# Patient Record
Sex: Female | Born: 1995 | Race: White | Hispanic: No | Marital: Married | Smoking: Current every day smoker
Health system: Southern US, Community
[De-identification: ages and names within clinical notes are randomized; demographics above are authoritative.]

## PROBLEM LIST (undated history)

## (undated) DIAGNOSIS — Z227 Latent tuberculosis: Secondary | ICD-10-CM

## (undated) DIAGNOSIS — F419 Anxiety disorder, unspecified: Secondary | ICD-10-CM

## (undated) DIAGNOSIS — R251 Tremor, unspecified: Secondary | ICD-10-CM

## (undated) DIAGNOSIS — M1990 Unspecified osteoarthritis, unspecified site: Secondary | ICD-10-CM

## (undated) DIAGNOSIS — I10 Essential (primary) hypertension: Secondary | ICD-10-CM

## (undated) DIAGNOSIS — F4541 Pain disorder exclusively related to psychological factors: Secondary | ICD-10-CM

## (undated) DIAGNOSIS — T148XXA Other injury of unspecified body region, initial encounter: Secondary | ICD-10-CM

## (undated) DIAGNOSIS — R0989 Other specified symptoms and signs involving the circulatory and respiratory systems: Secondary | ICD-10-CM

## (undated) DIAGNOSIS — R29898 Other symptoms and signs involving the musculoskeletal system: Secondary | ICD-10-CM

## (undated) DIAGNOSIS — F329 Major depressive disorder, single episode, unspecified: Secondary | ICD-10-CM

## (undated) DIAGNOSIS — F1911 Other psychoactive substance abuse, in remission: Secondary | ICD-10-CM

## (undated) DIAGNOSIS — F32A Depression, unspecified: Secondary | ICD-10-CM

## (undated) DIAGNOSIS — H5010 Unspecified exotropia: Secondary | ICD-10-CM

## (undated) HISTORY — PX: WISDOM TOOTH EXTRACTION: SHX21

## (undated) HISTORY — PX: STRABISMUS SURGERY: SHX218

---
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
## 2014-02-27 ENCOUNTER — Encounter (HOSPITAL_COMMUNITY): Payer: Self-pay | Admitting: Emergency Medicine

## 2014-02-27 ENCOUNTER — Emergency Department (HOSPITAL_COMMUNITY)
Admission: EM | Admit: 2014-02-27 | Discharge: 2014-02-27 | Disposition: A | Discharge status: Home / Self Care | Payer: Medicaid Other | Attending: Emergency Medicine | Admitting: Emergency Medicine

## 2014-02-27 DIAGNOSIS — F17200 Nicotine dependence, unspecified, uncomplicated: Secondary | ICD-10-CM | POA: Insufficient documentation

## 2014-02-27 DIAGNOSIS — F329 Major depressive disorder, single episode, unspecified: Secondary | ICD-10-CM | POA: Insufficient documentation

## 2014-02-27 DIAGNOSIS — F3289 Other specified depressive episodes: Secondary | ICD-10-CM | POA: Insufficient documentation

## 2014-02-27 DIAGNOSIS — Z87828 Personal history of other (healed) physical injury and trauma: Secondary | ICD-10-CM | POA: Diagnosis not present

## 2014-02-27 DIAGNOSIS — F1410 Cocaine abuse, uncomplicated: Secondary | ICD-10-CM | POA: Diagnosis present

## 2014-02-27 DIAGNOSIS — Z3202 Encounter for pregnancy test, result negative: Secondary | ICD-10-CM | POA: Diagnosis not present

## 2014-02-27 DIAGNOSIS — F1210 Cannabis abuse, uncomplicated: Secondary | ICD-10-CM | POA: Insufficient documentation

## 2014-02-27 DIAGNOSIS — F1910 Other psychoactive substance abuse, uncomplicated: Secondary | ICD-10-CM

## 2014-02-27 LAB — RAPID URINE DRUG SCREEN, HOSP PERFORMED
Amphetamines: NOT DETECTED
Barbiturates: NOT DETECTED
Benzodiazepines: NOT DETECTED
COCAINE: POSITIVE — AB
OPIATES: NOT DETECTED
Tetrahydrocannabinol: POSITIVE — AB

## 2014-02-27 LAB — CBC WITH DIFFERENTIAL/PLATELET
Basophils Absolute: 0 10*3/uL (ref 0.0–0.1)
Basophils Relative: 0 % (ref 0–1)
EOS ABS: 0.3 10*3/uL (ref 0.0–0.7)
Eosinophils Relative: 4 % (ref 0–5)
HCT: 40.9 % (ref 36.0–46.0)
Hemoglobin: 13.8 g/dL (ref 12.0–15.0)
LYMPHS ABS: 2.3 10*3/uL (ref 0.7–4.0)
Lymphocytes Relative: 33 % (ref 12–46)
MCH: 29.9 pg (ref 26.0–34.0)
MCHC: 33.7 g/dL (ref 30.0–36.0)
MCV: 88.5 fL (ref 78.0–100.0)
Monocytes Absolute: 0.3 10*3/uL (ref 0.1–1.0)
Monocytes Relative: 5 % (ref 3–12)
Neutro Abs: 4.1 10*3/uL (ref 1.7–7.7)
Neutrophils Relative %: 58 % (ref 43–77)
Platelets: 313 10*3/uL (ref 150–400)
RBC: 4.62 MIL/uL (ref 3.87–5.11)
RDW: 13 % (ref 11.5–15.5)
WBC: 7 10*3/uL (ref 4.0–10.5)

## 2014-02-27 LAB — BASIC METABOLIC PANEL
Anion gap: 14 (ref 5–15)
BUN: 7 mg/dL (ref 6–23)
CO2: 23 mEq/L (ref 19–32)
Calcium: 9.5 mg/dL (ref 8.4–10.5)
Chloride: 104 mEq/L (ref 96–112)
Creatinine, Ser: 0.83 mg/dL (ref 0.50–1.10)
GLUCOSE: 93 mg/dL (ref 70–99)
Potassium: 4.1 mEq/L (ref 3.7–5.3)
SODIUM: 141 meq/L (ref 137–147)

## 2014-02-27 LAB — ETHANOL: Alcohol, Ethyl (B): 109 mg/dL — ABNORMAL HIGH (ref 0–11)

## 2014-02-27 LAB — POC URINE PREG, ED: Preg Test, Ur: NEGATIVE

## 2014-02-27 MED ORDER — LORAZEPAM 1 MG PO TABS: 0.0000 mg | ORAL_TABLET | Freq: Four times a day (QID) | ORAL | Status: DC

## 2014-02-27 MED ORDER — LORAZEPAM 1 MG PO TABS
0.0000 mg | ORAL_TABLET | Freq: Two times a day (BID) | ORAL | Status: DC
Start: 2014-03-01 — End: 2014-02-27
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
## 2014-02-27 NOTE — Progress Notes (Signed)
Patient declined available observation bed and requested discharge so [facility].  Patient reports that [facility] and confirmed beds [facility]. CSW informed Dr. Criss AlvineGoldston and Tresa EndoKelly, [facility] of the change in disposition.       Maryelizabeth Rowanressa Asiyah Pineau, MSW, [place]ada[place]CSWA, 02/27/2014 Evening Clinical Social Worker [phone]

## 2014-02-27 NOTE — ED Notes (Signed)
[facility] but pt refused and wanted to go home and [facility].  MD discharged.

## 2014-02-27 NOTE — ED Notes (Signed)
Pt requesting help with detox from ETOH, cocaine, Marijuana. Pt states that she was in AA and relapsed. Pt state her last use was today before she came in here for help.

## 2014-02-27 NOTE — ED Provider Notes (Signed)
Patient wants detox but does not want it here. Would like [facility] tomorrow. No SI/HI, not clinically intoxicated.  Audree CamelScott T Aseneth Hack, MD 02/27/14 1929

## 2014-02-27 NOTE — ED Provider Notes (Signed)
Patient requesting help with alcohol, cocaine and marijuana problems. She last used cocaine 7 hours ago. She sometimes injects powder cocaine. Last time 2 days ago. Last alcohol intake was 2 hours ago. Patient presently alert cooperative pleasant, Glasgow Coma Score 15. Amylase without difficulty. Does not appear intoxicated  Doug SouSam Brynja Marker, MD 02/27/14 [phone]
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
## 2014-03-02 NOTE — ED Provider Notes (Signed)
Medical screening examination/treatment/procedure(s) were conducted as a shared visit with non-physician practitioner(s) and myself.  I personally evaluated the patient during the encounter.   EKG Interpretation None       Doug SouSam Tru Leopard, MD 03/02/14 [phone]

## 2014-08-05 ENCOUNTER — Emergency Department (HOSPITAL_COMMUNITY): Payer: Medicaid Other

## 2014-08-05 ENCOUNTER — Emergency Department (HOSPITAL_COMMUNITY)
Admission: EM | Admit: 2014-08-05 | Discharge: 2014-08-06 | Disposition: A | Discharge status: Home / Self Care | Payer: Medicaid Other | Attending: Emergency Medicine | Admitting: Emergency Medicine

## 2014-08-05 ENCOUNTER — Encounter (HOSPITAL_COMMUNITY): Payer: Self-pay | Admitting: Emergency Medicine

## 2014-08-05 DIAGNOSIS — S1093XA Contusion of unspecified part of neck, initial encounter: Secondary | ICD-10-CM | POA: Insufficient documentation

## 2014-08-05 DIAGNOSIS — R4182 Altered mental status, unspecified: Secondary | ICD-10-CM | POA: Insufficient documentation

## 2014-08-05 DIAGNOSIS — S60811A Abrasion of right wrist, initial encounter: Secondary | ICD-10-CM | POA: Diagnosis not present

## 2014-08-05 DIAGNOSIS — R000 Tachycardia, unspecified: Secondary | ICD-10-CM | POA: Diagnosis not present

## 2014-08-05 DIAGNOSIS — Z8659 Personal history of other mental and behavioral disorders: Secondary | ICD-10-CM | POA: Insufficient documentation

## 2014-08-05 DIAGNOSIS — B002 Herpesviral gingivostomatitis and pharyngotonsillitis: Secondary | ICD-10-CM | POA: Insufficient documentation

## 2014-08-05 DIAGNOSIS — Y9389 Activity, other specified: Secondary | ICD-10-CM | POA: Insufficient documentation

## 2014-08-05 DIAGNOSIS — T148XXA Other injury of unspecified body region, initial encounter: Secondary | ICD-10-CM

## 2014-08-05 DIAGNOSIS — T7421XA Adult sexual abuse, confirmed, initial encounter: Secondary | ICD-10-CM | POA: Insufficient documentation

## 2014-08-05 DIAGNOSIS — X58XXXA Exposure to other specified factors, initial encounter: Secondary | ICD-10-CM | POA: Diagnosis not present

## 2014-08-05 DIAGNOSIS — Y998 Other external cause status: Secondary | ICD-10-CM | POA: Insufficient documentation

## 2014-08-05 DIAGNOSIS — Y9289 Other specified places as the place of occurrence of the external cause: Secondary | ICD-10-CM | POA: Diagnosis not present

## 2014-08-05 DIAGNOSIS — Z720 Tobacco use: Secondary | ICD-10-CM | POA: Diagnosis not present

## 2014-08-05 DIAGNOSIS — IMO0002 Reserved for concepts with insufficient information to code with codable children: Secondary | ICD-10-CM | POA: Insufficient documentation

## 2014-08-05 DIAGNOSIS — F1010 Alcohol abuse, uncomplicated: Secondary | ICD-10-CM

## 2014-08-05 LAB — CBG MONITORING, ED: GLUCOSE-CAPILLARY: 205 mg/dL — AB (ref 70–99)
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
## 2014-08-05 NOTE — ED Notes (Signed)
Patient repeatedly stating, "Don't hurt me" Reassurance given Labs postponed at this time
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
## 2014-08-05 NOTE — ED Notes (Addendum)
Bruising noted to right side of neck that radiates around to the back, right flank area, right hip and pelvis area

## 2014-08-05 NOTE — ED Notes (Signed)
Carol BreachDiane Burns-Holt (Patient's mother) [phone]

## 2014-08-05 NOTE — ED Notes (Signed)
Bed: RESA Expected date:  Expected time:  Means of arrival:  Comments: 38F Ankle pain BP 76/44

## 2014-08-05 NOTE — ED Notes (Signed)
Dr. Mora Bellmanni informed that patient's mother and female friend are in the waiting room Per MD, labs to be deferred until patient sobers up and is able to cooperate Per MD, bikini bottoms to be left in place in case SANE examination is performed when she is sober Patient resting comfortably with eyes closed  RR WNL--even and unlabored with equal rise and fall of chest Patient in NAD Side rails up
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
## 2014-08-05 NOTE — ED Notes (Signed)
Patient brought in by private vehicle for AMS and ETOH intoxication Patient drinking heavily tonight Patient with strong smell of ETOH upon arrival Patient arrives disoriented, combative with staff Patient with hx of mental health issues, substance abuse, self mutilation (cutter) Patient placed in restraints due to combative behavior
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
## 2014-08-05 NOTE — ED Notes (Signed)
Restraints not placed on patient at this time---patient able to cooperate

## 2014-08-06 LAB — COMPREHENSIVE METABOLIC PANEL
ALBUMIN: 3.9 g/dL (ref 3.5–5.2)
ALK PHOS: 73 U/L (ref 39–117)
ALT: 18 U/L (ref 0–35)
AST: 23 U/L (ref 0–37)
Anion gap: 7 (ref 5–15)
BILIRUBIN TOTAL: 0.3 mg/dL (ref 0.3–1.2)
BUN: 7 mg/dL (ref 6–23)
CHLORIDE: 110 meq/L (ref 96–112)
CO2: 25 mmol/L (ref 19–32)
Calcium: 8.4 mg/dL (ref 8.4–10.5)
Creatinine, Ser: 0.6 mg/dL (ref 0.50–1.10)
GFR calc Af Amer: >90 mL/min (ref >90)
GFR calc non Af Amer: >90 mL/min (ref >90)
Glucose, Bld: 91 mg/dL (ref 70–99)
POTASSIUM: 3.4 mmol/L — AB (ref 3.5–5.1)
SODIUM: 142 mmol/L (ref 135–145)
Total Protein: 6.8 g/dL (ref 6.0–8.3)

## 2014-08-06 LAB — I-STAT CHEM 8, ED
BUN: 5 mg/dL — ABNORMAL LOW (ref 6–23)
Calcium, Ion: 1.08 mmol/L — ABNORMAL LOW (ref 1.12–1.23)
Chloride: 109 mEq/L (ref 96–112)
Creatinine, Ser: 0.8 mg/dL (ref 0.50–1.10)
Glucose, Bld: 90 mg/dL (ref 70–99)
HEMATOCRIT: 37 % (ref 36.0–46.0)
HEMOGLOBIN: 12.6 g/dL (ref 12.0–15.0)
Potassium: 3.4 mmol/L — ABNORMAL LOW (ref 3.5–5.1)
SODIUM: 145 mmol/L (ref 135–145)
TCO2: 22 mmol/L (ref 0–100)

## 2014-08-06 LAB — CBC
HEMATOCRIT: 37.2 % (ref 36.0–46.0)
HEMOGLOBIN: 12.4 g/dL (ref 12.0–15.0)
MCH: 30.9 pg (ref 26.0–34.0)
MCHC: 33.3 g/dL (ref 30.0–36.0)
MCV: 92.8 fL (ref 78.0–100.0)
Platelets: 285 10*3/uL (ref 150–400)
RBC: 4.01 MIL/uL (ref 3.87–5.11)
RDW: 12.9 % (ref 11.5–15.5)
WBC: 6.6 10*3/uL (ref 4.0–10.5)

## 2014-08-06 LAB — SALICYLATE LEVEL

## 2014-08-06 LAB — ACETAMINOPHEN LEVEL: Acetaminophen (Tylenol), Serum: <10 ug/mL — ABNORMAL LOW (ref 10–30)

## 2014-08-06 LAB — ETHANOL: Alcohol, Ethyl (B): 226 mg/dL — ABNORMAL HIGH (ref 0–9)
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
## 2014-08-06 NOTE — ED Notes (Signed)
Pt left facility with [employer].

## 2014-08-06 NOTE — ED Notes (Signed)
Pt unable to void 

## 2014-08-06 NOTE — ED Notes (Signed)
Pt on phone with her father, trying to find a ride home.

## 2014-08-06 NOTE — Discharge Instructions (Signed)
Alcohol Intoxication [name] was seen today for alcohol intoxication. Follow-up with a primary physician for help quitting. If symptoms worsen come back to the emergency department immediately. Thank you. Alcohol intoxication occurs when you drink enough alcohol that it affects your ability to function. It can be mild or very severe. Drinking a lot of alcohol in a short time is called binge drinking. This can be very harmful. Drinking alcohol can also be more dangerous if you are taking medicines or other drugs. Some of the effects caused by alcohol may include:  Loss of coordination.  Changes in mood and behavior.  Unclear thinking.  Trouble talking (slurred speech).  Throwing up (vomiting).  Confusion.  Slowed breathing.  Twitching and shaking (seizures).  Loss of consciousness. HOME CARE  Do not drive after drinking alcohol.  Drink enough water and fluids to keep your pee (urine) clear or pale yellow. Avoid caffeine.  Only take medicine as told by your doctor. GET HELP IF:  You throw up (vomit) many times.  You do not feel better after a few days.  You frequently have alcohol intoxication. Your doctor can help decide if you should see a substance use treatment counselor. GET HELP RIGHT AWAY IF:  You become shaky when you stop drinking.  You have twitching and shaking.  You throw up blood. It may look bright red or like coffee grounds.  You notice blood in your poop (bowel movements).  You become lightheaded or pass out (faint). MAKE SURE YOU:   Understand these instructions.  Will watch your condition.  Will get help right away if you are not doing well or get worse. Document Released: 01/14/2008 Document Revised: 03/30/2013 Document Reviewed: 12/31/2012 [facility] Patient Information 2015 [employer] [place]. This information is not intended to replace advice given to you by your health care provider. Make sure you discuss any questions you have with your health  care provider.

## 2015-05-21 ENCOUNTER — Emergency Department (HOSPITAL_BASED_OUTPATIENT_CLINIC_OR_DEPARTMENT_OTHER): Payer: Medicaid Other

## 2015-05-21 ENCOUNTER — Emergency Department (HOSPITAL_BASED_OUTPATIENT_CLINIC_OR_DEPARTMENT_OTHER)
Admission: EM | Admit: 2015-05-21 | Discharge: 2015-05-21 | Disposition: A | Discharge status: Home / Self Care | Payer: Medicaid Other | Attending: Emergency Medicine | Admitting: Emergency Medicine

## 2015-05-21 ENCOUNTER — Encounter (HOSPITAL_BASED_OUTPATIENT_CLINIC_OR_DEPARTMENT_OTHER): Payer: Self-pay | Admitting: *Deleted

## 2015-05-21 DIAGNOSIS — R42 Dizziness and giddiness: Secondary | ICD-10-CM | POA: Diagnosis not present

## 2015-05-21 DIAGNOSIS — I10 Essential (primary) hypertension: Secondary | ICD-10-CM | POA: Insufficient documentation

## 2015-05-21 DIAGNOSIS — Z3202 Encounter for pregnancy test, result negative: Secondary | ICD-10-CM | POA: Diagnosis not present

## 2015-05-21 DIAGNOSIS — Z87828 Personal history of other (healed) physical injury and trauma: Secondary | ICD-10-CM | POA: Diagnosis not present

## 2015-05-21 DIAGNOSIS — Z79899 Other long term (current) drug therapy: Secondary | ICD-10-CM | POA: Diagnosis not present

## 2015-05-21 DIAGNOSIS — F329 Major depressive disorder, single episode, unspecified: Secondary | ICD-10-CM | POA: Diagnosis not present

## 2015-05-21 DIAGNOSIS — Z87891 Personal history of nicotine dependence: Secondary | ICD-10-CM | POA: Diagnosis not present

## 2015-05-21 DIAGNOSIS — J189 Pneumonia, unspecified organism: Secondary | ICD-10-CM

## 2015-05-21 DIAGNOSIS — F419 Anxiety disorder, unspecified: Secondary | ICD-10-CM | POA: Insufficient documentation

## 2015-05-21 DIAGNOSIS — R079 Chest pain, unspecified: Secondary | ICD-10-CM | POA: Diagnosis present

## 2015-05-21 DIAGNOSIS — J159 Unspecified bacterial pneumonia: Secondary | ICD-10-CM | POA: Diagnosis not present

## 2015-05-21 HISTORY — DX: Essential (primary) hypertension: I10

## 2015-05-21 LAB — CBC WITH DIFFERENTIAL/PLATELET
BASOS PCT: 0 %
Basophils Absolute: 0 10*3/uL (ref 0.0–0.1)
EOS PCT: 3 %
Eosinophils Absolute: 0.2 10*3/uL (ref 0.0–0.7)
HEMATOCRIT: 34.7 % — AB (ref 36.0–46.0)
Hemoglobin: 11.7 g/dL — ABNORMAL LOW (ref 12.0–15.0)
Lymphocytes Relative: 38 %
Lymphs Abs: 2.4 10*3/uL (ref 0.7–4.0)
MCH: 29.6 pg (ref 26.0–34.0)
MCHC: 33.7 g/dL (ref 30.0–36.0)
MCV: 87.8 fL (ref 78.0–100.0)
MONO ABS: 0.5 10*3/uL (ref 0.1–1.0)
MONOS PCT: 8 %
NEUTROS ABS: 3.2 10*3/uL (ref 1.7–7.7)
Neutrophils Relative %: 51 %
PLATELETS: 281 10*3/uL (ref 150–400)
RBC: 3.95 MIL/uL (ref 3.87–5.11)
RDW: 12.9 % (ref 11.5–15.5)
WBC: 6.3 10*3/uL (ref 4.0–10.5)

## 2015-05-21 LAB — BASIC METABOLIC PANEL
Anion gap: 5 (ref 5–15)
BUN: 9 mg/dL (ref 6–20)
CALCIUM: 9.5 mg/dL (ref 8.9–10.3)
CO2: 25 mmol/L (ref 22–32)
CREATININE: 0.69 mg/dL (ref 0.44–1.00)
Chloride: 106 mmol/L (ref 101–111)
GFR calc Af Amer: >60 mL/min (ref >60)
GFR calc non Af Amer: >60 mL/min (ref >60)
GLUCOSE: 80 mg/dL (ref 65–99)
Potassium: 4.1 mmol/L (ref 3.5–5.1)
Sodium: 136 mmol/L (ref 135–145)

## 2015-05-21 LAB — D-DIMER, QUANTITATIVE (NOT AT ARMC): D DIMER QUANT: 1.25 ug{FEU}/mL — AB (ref 0.00–0.48)

## 2015-05-21 LAB — PREGNANCY, URINE: Preg Test, Ur: NEGATIVE

## 2015-05-21 MED ORDER — AZITHROMYCIN 250 MG PO TABS: 250.0000 mg | ORAL_TABLET | Freq: Every day | ORAL | Status: DC

## 2015-05-21 MED ORDER — KETOROLAC TROMETHAMINE 60 MG/2ML IM SOLN
60.0000 mg | Freq: Once | INTRAMUSCULAR | Status: AC
  Administered 2015-05-21: 60 mg via INTRAMUSCULAR
  Filled 2015-05-21: qty 2

## 2015-05-21 MED ORDER — IOHEXOL 350 MG/ML SOLN
100.0000 mL | Freq: Once | INTRAVENOUS | Status: AC | PRN
  Administered 2015-05-21: 100 mL via INTRAVENOUS

## 2015-05-21 NOTE — ED Notes (Signed)
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

## 2015-05-21 NOTE — ED Notes (Signed)
PA at bedside.

## 2015-05-21 NOTE — ED Notes (Signed)
Patient transported to CT 

## 2015-05-21 NOTE — Discharge Instructions (Signed)
Please take your prescription of Azithromycin as prescribed, 2 tablets today and one tablet per day for the following 4 days.  Please follow up with your primary care provider in 3 days. Please return to the Emergency Department if symptoms worsen or new onset of fever, chills, difficulty breathing, productive cough.

## 2015-05-21 NOTE — ED Notes (Signed)
Pt from [facility] (in treatment for etoh and crack cocaine- states last used approx 9 months ago)- Reports she woke this morning with chest tightness and pressure. New meds started on 10/4. Also stated she found a lump in her left breast last night
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
## 2015-08-12 NOTE — L&D Delivery Note (Addendum)
Delivery Note  Pt progressed quickly to complete status. She pushed for 20mins and at 10:33 PM a viable female was delivered via Vaginal, Spontaneous Delivery (Presentation:OA ;  ).  APGAR: 9, 9; weight pending .   Placenta status: delivered spontaneous, shultz .  Cord:  with the following complications: none.    Anesthesia:  epidural Episiotomy: None Lacerations: Vaginal;1st degree Suture Repair: 2.0 vicryl Est. Blood Loss (mL): 200  Mom to postpartum.  Baby to Couplet care / Skin to Skin.  [name] 06/27/2016, 10:54 PM

## 2015-09-05 ENCOUNTER — Emergency Department (HOSPITAL_COMMUNITY)
Admission: EM | Admit: 2015-09-05 | Discharge: 2015-09-07 | Disposition: A | Discharge status: Other Acute Inpatient Hospital | Payer: Medicaid Other | Attending: Emergency Medicine | Admitting: Emergency Medicine

## 2015-09-05 DIAGNOSIS — F331 Major depressive disorder, recurrent, moderate: Secondary | ICD-10-CM | POA: Diagnosis not present

## 2015-09-05 DIAGNOSIS — Z79899 Other long term (current) drug therapy: Secondary | ICD-10-CM | POA: Diagnosis not present

## 2015-09-05 DIAGNOSIS — F4310 Post-traumatic stress disorder, unspecified: Secondary | ICD-10-CM | POA: Insufficient documentation

## 2015-09-05 DIAGNOSIS — I10 Essential (primary) hypertension: Secondary | ICD-10-CM | POA: Insufficient documentation

## 2015-09-05 DIAGNOSIS — R45851 Suicidal ideations: Secondary | ICD-10-CM | POA: Diagnosis not present

## 2015-09-05 DIAGNOSIS — Z87891 Personal history of nicotine dependence: Secondary | ICD-10-CM | POA: Diagnosis not present

## 2015-09-05 DIAGNOSIS — Z3202 Encounter for pregnancy test, result negative: Secondary | ICD-10-CM | POA: Insufficient documentation

## 2015-09-05 DIAGNOSIS — R441 Visual hallucinations: Secondary | ICD-10-CM | POA: Insufficient documentation

## 2015-09-05 DIAGNOSIS — F419 Anxiety disorder, unspecified: Secondary | ICD-10-CM | POA: Insufficient documentation

## 2015-09-05 DIAGNOSIS — F322 Major depressive disorder, single episode, severe without psychotic features: Secondary | ICD-10-CM | POA: Diagnosis not present

## 2015-09-05 DIAGNOSIS — Z046 Encounter for general psychiatric examination, requested by authority: Secondary | ICD-10-CM | POA: Diagnosis present

## 2015-09-05 DIAGNOSIS — F1920 Other psychoactive substance dependence, uncomplicated: Secondary | ICD-10-CM | POA: Diagnosis present

## 2015-09-05 DIAGNOSIS — R440 Auditory hallucinations: Secondary | ICD-10-CM | POA: Diagnosis not present

## 2015-09-05 NOTE — ED Notes (Addendum)
Pt BIB police after being IVC'd by her mother for suicidal ideation and suicide attempt. Per paperwork pt has PTSD and tried to hang herself last night. Pt states that she has been IVC'd before by her mother and she has had it overturned because her mother has mental health dx herself. Denies trying to hang herself. No marks on her neck. States she only cut herself self mutilation not suicide attempt. Alert and oriented.

## 2015-09-06 DIAGNOSIS — F4310 Post-traumatic stress disorder, unspecified: Secondary | ICD-10-CM

## 2015-09-06 DIAGNOSIS — F331 Major depressive disorder, recurrent, moderate: Secondary | ICD-10-CM

## 2015-09-06 LAB — ACETAMINOPHEN LEVEL

## 2015-09-06 LAB — CBC
HCT: 37.6 % (ref 36.0–46.0)
Hemoglobin: 12.4 g/dL (ref 12.0–15.0)
MCH: 30.8 pg (ref 26.0–34.0)
MCHC: 33 g/dL (ref 30.0–36.0)
MCV: 93.5 fL (ref 78.0–100.0)
Platelets: 295 10*3/uL (ref 150–400)
RBC: 4.02 MIL/uL (ref 3.87–5.11)
RDW: 14 % (ref 11.5–15.5)
WBC: 9.5 10*3/uL (ref 4.0–10.5)

## 2015-09-06 LAB — COMPREHENSIVE METABOLIC PANEL
ALT: 17 U/L (ref 14–54)
ANION GAP: 12 (ref 5–15)
AST: 21 U/L (ref 15–41)
Albumin: 4.1 g/dL (ref 3.5–5.0)
Alkaline Phosphatase: 69 U/L (ref 38–126)
BILIRUBIN TOTAL: 0.3 mg/dL (ref 0.3–1.2)
BUN: 11 mg/dL (ref 6–20)
CO2: 23 mmol/L (ref 22–32)
Calcium: 9.2 mg/dL (ref 8.9–10.3)
Chloride: 104 mmol/L (ref 101–111)
Creatinine, Ser: 0.65 mg/dL (ref 0.44–1.00)
Glucose, Bld: 92 mg/dL (ref 65–99)
POTASSIUM: 3.7 mmol/L (ref 3.5–5.1)
Sodium: 139 mmol/L (ref 135–145)
TOTAL PROTEIN: 7.2 g/dL (ref 6.5–8.1)

## 2015-09-06 LAB — RAPID URINE DRUG SCREEN, HOSP PERFORMED
Amphetamines: NOT DETECTED
BENZODIAZEPINES: NOT DETECTED
Barbiturates: NOT DETECTED
COCAINE: NOT DETECTED
Opiates: NOT DETECTED
Tetrahydrocannabinol: NOT DETECTED

## 2015-09-06 LAB — SALICYLATE LEVEL: Salicylate Lvl: <4 mg/dL (ref 2.8–30.0)

## 2015-09-06 LAB — I-STAT BETA HCG BLOOD, ED (MC, WL, AP ONLY)

## 2015-09-06 LAB — ETHANOL: ALCOHOL ETHYL (B): 15 mg/dL — AB (ref <5)

## 2015-09-06 MED ORDER — SERTRALINE HCL 50 MG PO TABS
50.0000 mg | ORAL_TABLET | Freq: Every day | ORAL | Status: DC
  Administered 2015-09-06 – 2015-09-07 (×2): 50 mg via ORAL
  Filled 2015-09-06 (×2): qty 1

## 2015-09-06 MED ORDER — GABAPENTIN 300 MG PO CAPS
300.0000 mg | ORAL_CAPSULE | Freq: Three times a day (TID) | ORAL | Status: DC
  Administered 2015-09-06 – 2015-09-07 (×3): 300 mg via ORAL
  Filled 2015-09-06 (×3): qty 1

## 2015-09-06 MED ORDER — NICOTINE 21 MG/24HR TD PT24
21.0000 mg | MEDICATED_PATCH | Freq: Every day | TRANSDERMAL | Status: DC
  Administered 2015-09-06 – 2015-09-07 (×2): 21 mg via TRANSDERMAL
  Filled 2015-09-06 (×2): qty 1

## 2015-09-06 MED ORDER — OLANZAPINE 10 MG PO TBDP: 10.0000 mg | ORAL_TABLET | Freq: Three times a day (TID) | ORAL | Status: DC | PRN

## 2015-09-06 MED ORDER — HYDROXYZINE HCL 25 MG PO TABS
25.0000 mg | ORAL_TABLET | Freq: Three times a day (TID) | ORAL | Status: DC | PRN
  Administered 2015-09-06 – 2015-09-07 (×2): 25 mg via ORAL
  Filled 2015-09-06 (×2): qty 1

## 2015-09-06 MED ORDER — PRAZOSIN HCL 2 MG PO CAPS
2.0000 mg | ORAL_CAPSULE | Freq: Every day | ORAL | Status: DC
  Filled 2015-09-06 (×2): qty 1

## 2015-09-06 NOTE — [facility] Counselor (Signed)
Referral submitted to [facility], [place], and [place] on 09/06/15 Lasya Vetter K. Jesus Genera, [facility]  Counselor 09/06/2015 5:38 PM
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
## 2015-09-06 NOTE — ED Notes (Addendum)
Pt c/o h/a.  Dr. Patria Mane informed.  No new orders received.
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
## 2015-09-06 NOTE — ED Notes (Signed)
Pt's boyfriend has been wanded and is now visiting with patient.  Sitter remains at bedside observing.

## 2015-09-06 NOTE — [facility] Counselor (Signed)
Psych disposition: Per Donell Sievert, PA-C, AM psych eval is recommended to uphold or rescind IVC.   Cyndie Mull, [facility]   Therapeutic Triage    Ext [phone]
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
## 2015-09-06 NOTE — Progress Notes (Signed)
Pending review for possible placement with [facility].   09/06/2015 [name], MS, NCC, LPCA Therapeutic Triage Specialist  

## 2015-09-06 NOTE — ED Notes (Signed)
Breakfast tray given. °

## 2015-09-06 NOTE — BH Assessment (Signed)
[facility] Assessment Progress Note  The following facilities have been contacted to seek placement for this pt, with results as noted:  Beds available, information sent, decision pending:  [facility]   At capacity:  [facility] [place]   Doylene Canning, [place] Triage Specialist [phone]
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
## 2015-09-06 NOTE — ED Notes (Signed)
Psych MD at bedside

## 2015-09-06 NOTE — ED Notes (Signed)
This Consulting civil engineer and [place] RN ([facility]) spoke w/ Pt, Pt's female visitor, and Pt's father regarding her IVC and the [facility] process at length.  Pt voiced that she does not care for Dr. Jannifer Franklin and would like to see another provider.  Pt informed that she would be reassessed by a different provider within 24hrs.  Pt informed that she has been accepted at [facility] and they have discharges pending, but she does not currently have a bed assigned.  Pt still upset, but verbalized understanding.    Please note: This Consulting civil engineer and [facility] received verbal permission from Pt to discuss care in front of visitors.

## 2015-09-06 NOTE — ED Notes (Signed)
Pt's father has called and requested to speak to her.  Portable phone given to patient to update him.

## 2015-09-07 ENCOUNTER — Inpatient Hospital Stay
Admission: EM | Admit: 2015-09-07 | Discharge: 2015-09-09 | DRG: 882 | Disposition: A | Discharge status: Home / Self Care | Payer: Medicaid Other | Source: Intra-hospital | Attending: Psychiatry | Admitting: Psychiatry

## 2015-09-07 DIAGNOSIS — F322 Major depressive disorder, single episode, severe without psychotic features: Secondary | ICD-10-CM | POA: Diagnosis present

## 2015-09-07 DIAGNOSIS — Z79899 Other long term (current) drug therapy: Secondary | ICD-10-CM | POA: Diagnosis not present

## 2015-09-07 DIAGNOSIS — Z87891 Personal history of nicotine dependence: Secondary | ICD-10-CM | POA: Diagnosis not present

## 2015-09-07 DIAGNOSIS — Z9889 Other specified postprocedural states: Secondary | ICD-10-CM | POA: Diagnosis not present

## 2015-09-07 DIAGNOSIS — F1010 Alcohol abuse, uncomplicated: Secondary | ICD-10-CM | POA: Diagnosis present

## 2015-09-07 DIAGNOSIS — Z915 Personal history of self-harm: Secondary | ICD-10-CM | POA: Diagnosis not present

## 2015-09-07 DIAGNOSIS — Z813 Family history of other psychoactive substance abuse and dependence: Secondary | ICD-10-CM | POA: Diagnosis not present

## 2015-09-07 DIAGNOSIS — F4310 Post-traumatic stress disorder, unspecified: Principal | ICD-10-CM | POA: Diagnosis present

## 2015-09-07 DIAGNOSIS — I10 Essential (primary) hypertension: Secondary | ICD-10-CM | POA: Diagnosis present

## 2015-09-07 DIAGNOSIS — Z91410 Personal history of adult physical and sexual abuse: Secondary | ICD-10-CM | POA: Diagnosis not present

## 2015-09-07 DIAGNOSIS — Z811 Family history of alcohol abuse and dependence: Secondary | ICD-10-CM

## 2015-09-07 DIAGNOSIS — F329 Major depressive disorder, single episode, unspecified: Secondary | ICD-10-CM | POA: Insufficient documentation

## 2015-09-07 MED ORDER — MAGNESIUM HYDROXIDE 400 MG/5ML PO SUSP: 30.0000 mL | Freq: Every day | ORAL | Status: DC | PRN

## 2015-09-07 MED ORDER — ALUM & MAG HYDROXIDE-SIMETH 200-200-20 MG/5ML PO SUSP: 30.0000 mL | ORAL | Status: DC | PRN

## 2015-09-07 MED ORDER — ACETAMINOPHEN 325 MG PO TABS: 650.0000 mg | ORAL_TABLET | Freq: Four times a day (QID) | ORAL | Status: DC | PRN

## 2015-09-07 MED ORDER — PRAZOSIN HCL 1 MG PO CAPS
2.0000 mg | ORAL_CAPSULE | Freq: Every day | ORAL | Status: DC
  Administered 2015-09-07 – 2015-09-08 (×2): 2 mg via ORAL
  Filled 2015-09-07 (×2): qty 2

## 2015-09-07 MED ORDER — SERTRALINE HCL 25 MG PO TABS
50.0000 mg | ORAL_TABLET | Freq: Every day | ORAL | Status: DC
  Administered 2015-09-08 – 2015-09-09 (×2): 50 mg via ORAL
  Filled 2015-09-07 (×2): qty 2

## 2015-09-07 MED ORDER — GABAPENTIN 300 MG PO CAPS
300.0000 mg | ORAL_CAPSULE | Freq: Three times a day (TID) | ORAL | Status: DC
  Administered 2015-09-07 – 2015-09-09 (×6): 300 mg via ORAL
  Filled 2015-09-07 (×6): qty 1

## 2015-09-07 MED ORDER — NICOTINE 21 MG/24HR TD PT24
21.0000 mg | MEDICATED_PATCH | Freq: Every day | TRANSDERMAL | Status: DC
  Administered 2015-09-07 – 2015-09-09 (×3): 21 mg via TRANSDERMAL
  Filled 2015-09-07 (×3): qty 1

## 2015-09-07 NOTE — Progress Notes (Signed)
Patient ID: [name], female   DOB: [birth year], 20 y.o.   MRN: [id number]  Pt admitted from [facility] IVC'd by her mother, pt has superficial cuts to right arm and multiple tattoos, denies SI/HI/AVH, pleasant and cooperative throughout the admission process, per the patient she does not feel depressed, oriented to unit and rules, no complaints at this time.

## 2015-09-07 NOTE — BH Assessment (Signed)
Patient has been accepted to [facility].  Accepting physician is Dr. Jennet Maduro.  Attending Physician will be Dr. Jennet Maduro.  Patient has been assigned to room 304, by [facility] Charge Nurse Phyllis   Call report to [phone].  Representative/Transfer Coordinator is Rohaan Durnil.  [facility] ER Staff Maisie Fus, H., TTS) made aware of acceptance.

## 2015-09-07 NOTE — ED Notes (Addendum)
Pt is asleep per night shift nurse [[t was found to have her BF's dog tag chain around her neck.This was removed and locked up. Pt did not have this chain on during the day yesterday. Pt remains with a sitter.Repsitation are regular and non labored. Pt was transferred to room 36 in the [facility]. She ate breakfast and tolerated well. Report to [facility].(8:30am)

## 2015-09-07 NOTE — ED Notes (Signed)
Pt stated "the psychiatrist wouldn't even listen to me today.  He told me he didn't have time.  People take 1 look at you with cuts on your arm and tattoos and don't want anything to do with you.  I'm actually violating parole now because I drank a beer that my mother took from me and I didn't even get to finish it.  I was in prison for B&E.  I signed for my mother to have all information from my therapy visits; that's the only way I could live with her.  She got mad because I was going to testify on behalf of my father.  She is saying my dad is a pedophile with my 62 y/o and he is not a pedophile.  I was actually supposed to testifying on his behalf but I'm here.  My dad has remarried, lost his apt & is living in his office that he's about to lose too.  He has given my mother all of his money.  My mother is just mad.  She has a boyfriend now though but it doesn't make a difference.  She said I tried to hang myself but that's a lie.  I did cut myself and now I wish I hadn't.  I won't do it anymore.  My mother came to see me here for about 5 minutes."

## 2015-09-07 NOTE — Tx Team (Signed)
Initial Interdisciplinary Treatment Plan   PATIENT STRESSORS: Marital or family conflict   PATIENT STRENGTHS: Capable of independent living Motivation for treatment/growth   PROBLEM LIST: Problem List/Patient Goals Date to be addressed Date deferred Reason deferred Estimated date of resolution  Self Harm-Cutting                                                       DISCHARGE CRITERIA:  Ability to meet basic life and health needs Improved stabilization in mood, thinking, and/or behavior Motivation to continue treatment in a less acute level of care Need for constant or close observation no longer present Verbal commitment to aftercare and medication compliance  PRELIMINARY DISCHARGE PLAN: Placement in alternative living arrangements  PATIENT/FAMIILY INVOLVEMENT: This treatment plan has been presented to and reviewed with the patient, [name].  The patient and family have been given the opportunity to ask questions and make suggestions.  [name] 09/07/2015, 6:14 PM

## 2015-09-07 NOTE — BH Assessment (Signed)
BHH Assessment Progress Note  Per Thedore Mins, MD, this pt requires psychiatric hospitalization at this time. Robinette Haines at [facility] reports that pt has been accepted to their facility by Dr. Jennet Maduro. Dr Jannifer Franklin concurs with this decision.  Pt is under IVC initiated by her mother and upheld by Dr Jannifer Franklin. IVC documents have been faxed to [facility].  Pt's nurse, Kendal Hymen, has been notified, and agrees to call report to [phone].  Pt is to be transported via [facility].  Doylene Canning, MA Triage Specialist [phone]

## 2015-09-07 NOTE — BH Assessment (Addendum)
Patient was reassessed by TTS.   Patient states that she was placed under IVC by her mother after cutting her wrist. Patient states that she previously cut herself "years back" but decided to cut recently and 'I regret it now." Patient denies previous suicidal attempts. Patient denies HI and AVH. Patient does not appear to be responding to internal stimuli. Patient states that she does not have any questions or concerns at this time.   Dr. Jannifer Franklinayo continues to recommend inpatient treatment at this time. TTS to seek placement on [address] at this time. TTS to seek placement.   Davina Poke, LCSW Therapeutic Triage Specialist [facility] 09/07/2015 9:18 AM

## 2015-09-07 NOTE — ED Notes (Signed)
Pt dogtags placed in pt belongings bag in pts locker.

## 2015-09-07 NOTE — ED Notes (Signed)
Pt oriented to room and unit.  Pt states she is not suicidal but is depressed.  Right arm has multiple superficial cuts which pt. States she regrets doing.  15 minute checks and video monitoring continue.

## 2015-09-07 NOTE — ED Notes (Signed)
Pt discharged ambulatory with [facility].  All belongings were returned to pt.  She was in no distress at discharge.

## 2015-09-08 DIAGNOSIS — F4310 Post-traumatic stress disorder, unspecified: Principal | ICD-10-CM

## 2015-09-08 NOTE — Plan of Care (Signed)
Problem: Ineffective individual coping Goal: STG: Patient will remain free from self harm Outcome: Progressing No self harm reported or observed     

## 2015-09-08 NOTE — [facility] Suicide Risk Assessment (Signed)
[facility] INPATIENT:  Family/Significant Other Suicide Prevention Education  Suicide Prevention Education:  Education Completed; Annalina Needles (friend) [phone] has been identified by the patient as the family member/significant other with whom the patient will be residing, and identified as the person(s) who will aid the patient in the event of a mental health crisis (suicidal ideations/suicide attempt).  With written consent from the patient, the family member/significant other has been provided the following suicide prevention education, prior to the and/or following the discharge of the patient.  The suicide prevention education provided includes the following:  Suicide risk factors  Suicide prevention and interventions  National Suicide Hotline telephone number  [facility] assessment telephone number  [facility] Emergency Assistance 911  [facility] and/or Residential Mobile Crisis Unit telephone number  Request made of family/significant other to:  Remove weapons (e.g., guns, rifles, knives), all items previously/currently identified as safety concern.    Remove drugs/medications (over-the-counter, prescriptions, illicit drugs), all items previously/currently identified as a safety concern.  The family member/significant other verbalizes understanding of the suicide prevention education information provided.  The family member/significant other agrees to remove the items of safety concern listed above.  [employer] MSW, LCSWA  09/08/2015, 3:27 PM

## 2015-09-08 NOTE — Progress Notes (Signed)
D: Pt denies SI/HI/AVH. Pt is pleasant and cooperative, affect is bright, she appears less anxious and she is interacting with peers and staff appropriately.  A: Pt was offered support and encouragement. Pt was given scheduled medications. Pt was encouraged to attend groups. Q 15 minute checks were done for safety.  R:Pt attends groups and interacts well with peers and staff. Pt is taking medication. Pt has no complaints.Pt receptive to treatment and safety maintained on unit.   

## 2015-09-08 NOTE — Plan of Care (Signed)
Problem: Alteration in mood Goal: LTG-Patient reports reduction in suicidal thoughts (Patient reports reduction in suicidal thoughts and is able to verbalize a safety plan for whenever patient is feeling suicidal)  Outcome: Progressing Patient denies SI/HI.      

## 2015-09-08 NOTE — [facility] Group Notes (Signed)
[facility] Group Notes:  (Nursing/MHT/Case Management/Adjunct)  Date:  09/08/2015  Time:  12:13 PM  Type of Therapy:  Group Therapy  Participation Level:  Active  Participation Quality:  Appropriate, Attentive and Sharing  Affect:  Appropriate  Cognitive:  Alert, Appropriate and Oriented  Insight:  Appropriate  Engagement in Group:  Engaged  Modes of Intervention:  Discussion and Problem-solving  Summary of Progress/Problems:  [name] [name] 09/08/2015, 12:13 PM
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
## 2015-09-08 NOTE — B[facility] Group Notes (Signed)
BHH LCSW Group Therapy  09/08/2015 2:23 PM  Type of Therapy:  Group Therapy  Participation Level:  Minimal  Participation Quality:  Attentive  Affect:  Flat  Cognitive:  Alert  Insight:  Limited  Engagement in Therapy:  Limited  Modes of Intervention:  Discussion, Education, Socialization and Support  Summary of Progress/Problems: Todays topic: Grudges  Patients will be encouraged to discuss their thoughts, feelings, and behaviors as to why one holds on to grudges and reasons why people have grudges. Patients will process the impact of grudges on their daily lives and identify thoughts and feelings related to holding grudges. Patients will identify feelings and thoughts related to what life would look like without grudges. Pt attended group and stayed the entire time. She sat quietly and listened to other group members.   [employer] MSW, LCSWA  09/08/2015, 2:23 PM
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
## 2015-09-08 NOTE — Progress Notes (Signed)
Pt pleasant and cooperative with care. Appropriate with staff and peers. No negative behaviors. Med and group compliant. Denies SI, HI, AVH. States mom put her in here d/t her not testifying against her dad. Reports sleeping good with good appetite and normal energy level/. Denies anxiety, hopelessness or depression. Encouragement and support offered. Pt receptive and remains safe on unit.

## 2015-09-09 MED ORDER — SERTRALINE HCL 50 MG PO TABS: 50.0000 mg | ORAL_TABLET | Freq: Every day | ORAL | Status: DC

## 2015-09-09 MED ORDER — PRAZOSIN HCL 2 MG PO CAPS: 2.0000 mg | ORAL_CAPSULE | Freq: Every day | ORAL | Status: DC

## 2015-09-09 MED ORDER — GABAPENTIN 300 MG PO CAPS: 300.0000 mg | ORAL_CAPSULE | Freq: Three times a day (TID) | ORAL | Status: DC
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
## 2015-09-09 NOTE — Progress Notes (Signed)
Pt discharged from behavior med. All instructions reviewed with pt. Pt stated she understood all instructions and would comply with same. Pt denies SI and A/V hallucinations.

## 2015-09-09 NOTE — Tx Team (Signed)
Interdisciplinary Treatment Plan Update (Adult)  Date:  09/09/2015 Time Reviewed:  3:57 PM  Progress in Treatment: Attending groups: Yes. Participating in groups:  Yes. Taking medication as prescribed:  Yes. Tolerating medication:  Yes. Family/Significant othe contact made:  Yes, individual(s) contacted:  [name]  Patient understands diagnosis:  Yes. Discussing patient identified problems/goals with staff:  Yes. Medical problems stabilized or resolved:  Yes. Denies suicidal/homicidal ideation: Yes. Issues/concerns per patient self-inventory:  Yes. Other:  New problem(s) identified: No, Describe:  NA  Discharge Plan or Barriers: Pt plans to return home and follow up with outpatient.    Reason for Continuation of Hospitalization: Depression Medication stabilization Suicidal ideation  Comments:20 year old woman sent to [place] from [facility]. Patient was sent there under IVC early on the 26th filled out by her mother. Ever since first coming into the emergency room that night she has consistently told the same story. Patient states that her mother filled out the commitment paperwork in order to get her, the patient, away from being involved in the legal proceedings of her parents divorce. Patient denies any suicidal ideation. She says that about a week ago she cut herself superficially on the arm because she was under a lot of stress. She had no suicidal thoughts at that time. She absolutely denies that she tried to hang herself and there has been no evidence to the contrary. Patient says her mood has been stable. Sleep is pretty good. Denies auditory or visual hallucinations. Denies any suicidal ideation. She says she had been off of her prescribed psychiatric medicine for several days because there was some chaos around her house. She admits that she continues to drink alcohol but says that it's only on the weekends and is much less than she used to. Denies that she's been abusing  any other drugs. She says she has been continuing to go to outpatient treatment with her therapist and doctors at [place]. Had a recent stress of having a miscarriage a little over a week ago. Also finds a chronically stressful living with her mother.  Estimated length of stay: Pt will likely d/c today.   New goal(s): NA  Review of initial/current patient goals per problem list:   1.  Goal(s): Patient will participate in aftercare plan * Met:  * Target date: at discharge * As evidenced by: Patient will participate within aftercare plan AEB aftercare provider and housing plan at discharge being identified.   2.  Goal (s): Patient will exhibit decreased depressive symptoms and suicidal ideations. * Met:  *  Target date: at discharge * As evidenced by: Patient will utilize self rating of depression at 3 or below and demonstrate decreased signs of depression or be deemed stable for discharge by MD.   3.  Goal(s): Patient will demonstrate decreased signs and symptoms of anxiety. * Met:  * Target date: at discharge * As evidenced by: Patient will utilize self rating of anxiety at 3 or below and demonstrated decreased signs of anxiety, or be deemed stable for discharge by MD   4.  Goal(s): Patient will demonstrate decreased signs of withdrawal due to substance abuse * Met:  * Target date: at discharge * As evidenced by: Patient will produce a CIWA/COWS score of 0, have stable vitals signs, and no symptoms of withdrawal.  Attendees: Patient:  [name] 1/29/20173:57 PM  Family:   1/29/20173:57 PM  Physician:  Dr. Weber Cooks  1/29/20173:57 PM  Nursing:   Ozzie Hoyle, RN  1/29/20173:57 PM  Case Manager:   1/29/20173:57 PM  Counselor:   1/29/20173:57 PM  Other:  Wray Kearns, LCSWA 1/29/20173:57 PM  Other:   1/29/20173:57 PM  Other:   1/29/20173:57 PM  Other:  1/29/20173:57 PM  Other:  1/29/20173:57 PM  Other:  1/29/20173:57 PM  Other:  1/29/20173:57 PM  Other:  1/29/20173:57 PM  Other:   1/29/20173:57 PM  Other:   1/29/20173:57 PM   Scribe for Treatment Team:   Lourena Simmonds, [place]   09/09/2015, 3:57 PM
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
## 2015-09-09 NOTE — Progress Notes (Signed)
  [facility] Adult Case Management Discharge Plan :  Will you be returning to the same living situation after discharge:  Yes,  Home At discharge, do you have transportation home?: Yes,  friend Do you have the ability to pay for your medications: Yes,  insurance   Release of information consent forms completed and in the chart;  Patient's signature needed at discharge.  Patient to Follow up at: Follow-up Information    Follow up with [facility]. Go in 3 days.   Specialty:  Behavioral Health   Why:  Your hospital follow up will be walk in. Walk in hours are Monday through Friday between 8:00am and 10:00am.    Contact information:   [address] [place] [address] [phone]       Next level of care provider has access to [facility] Link:no  Safety Planning and Suicide Prevention discussed: Yes,  with patient and friend  Have you used any form of tobacco in the last 30 days? (Cigarettes, Smokeless Tobacco, Cigars, and/or Pipes): Yes  Has patient been referred to the Quitline?: Patient refused referral  Patient has been referred for addiction treatment: Pt refused   [employer] MSW, LCSWA  09/09/2015, 3:54 PM
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
## 2016-06-27 ENCOUNTER — Inpatient Hospital Stay (HOSPITAL_COMMUNITY): Payer: Medicaid Other | Admitting: Anesthesiology

## 2016-06-27 ENCOUNTER — Inpatient Hospital Stay (HOSPITAL_COMMUNITY)
Admission: AD | Admit: 2016-06-27 | Discharge: 2016-06-29 | DRG: 775 | Disposition: A | Discharge status: Home / Self Care | Payer: Medicaid Other | Source: Ambulatory Visit | Attending: Obstetrics and Gynecology | Admitting: Obstetrics and Gynecology

## 2016-06-27 ENCOUNTER — Encounter (HOSPITAL_COMMUNITY): Payer: Self-pay | Admitting: *Deleted

## 2016-06-27 DIAGNOSIS — Z87891 Personal history of nicotine dependence: Secondary | ICD-10-CM | POA: Diagnosis not present

## 2016-06-27 DIAGNOSIS — Z3A38 38 weeks gestation of pregnancy: Secondary | ICD-10-CM | POA: Diagnosis not present

## 2016-06-27 DIAGNOSIS — O4202 Full-term premature rupture of membranes, onset of labor within 24 hours of rupture: Secondary | ICD-10-CM | POA: Diagnosis present

## 2016-06-27 DIAGNOSIS — O4290 Premature rupture of membranes, unspecified as to length of time between rupture and onset of labor, unspecified weeks of gestation: Secondary | ICD-10-CM | POA: Diagnosis present

## 2016-06-27 LAB — CBC
HCT: 32.4 % — ABNORMAL LOW (ref 36.0–46.0)
Hemoglobin: 11.6 g/dL — ABNORMAL LOW (ref 12.0–15.0)
MCH: 32.2 pg (ref 26.0–34.0)
MCHC: 35.8 g/dL (ref 30.0–36.0)
MCV: 90 fL (ref 78.0–100.0)
PLATELETS: 229 10*3/uL (ref 150–400)
RBC: 3.6 MIL/uL — AB (ref 3.87–5.11)
RDW: 12.9 % (ref 11.5–15.5)
WBC: 11.4 10*3/uL — AB (ref 4.0–10.5)

## 2016-06-27 LAB — OB RESULTS CONSOLE RPR: RPR: NONREACTIVE

## 2016-06-27 LAB — OB RESULTS CONSOLE HIV ANTIBODY (ROUTINE TESTING): HIV: NONREACTIVE

## 2016-06-27 LAB — OB RESULTS CONSOLE GC/CHLAMYDIA
CHLAMYDIA, DNA PROBE: NEGATIVE
Gonorrhea: NEGATIVE

## 2016-06-27 LAB — TYPE AND SCREEN
ABO/RH(D): B POS
Antibody Screen: NEGATIVE

## 2016-06-27 LAB — OB RESULTS CONSOLE ABO/RH: RH Type: POSITIVE

## 2016-06-27 LAB — OB RESULTS CONSOLE HEPATITIS B SURFACE ANTIGEN: HEP B S AG: NEGATIVE

## 2016-06-27 LAB — ABO/RH: ABO/RH(D): B POS

## 2016-06-27 LAB — OB RESULTS CONSOLE ANTIBODY SCREEN: Antibody Screen: NEGATIVE

## 2016-06-27 LAB — OB RESULTS CONSOLE RUBELLA ANTIBODY, IGM: RUBELLA: IMMUNE

## 2016-06-27 LAB — OB RESULTS CONSOLE GBS: GBS: NEGATIVE

## 2016-06-27 MED ORDER — OXYCODONE-ACETAMINOPHEN 5-325 MG PO TABS: 2.0000 | ORAL_TABLET | ORAL | Status: DC | PRN

## 2016-06-27 MED ORDER — PHENYLEPHRINE 40 MCG/ML (10ML) SYRINGE FOR IV PUSH (FOR BLOOD PRESSURE SUPPORT)
80.0000 ug | PREFILLED_SYRINGE | INTRAVENOUS | Status: DC | PRN
  Administered 2016-06-27: 80 ug via INTRAVENOUS
  Filled 2016-06-27: qty 10
  Filled 2016-06-27: qty 5
  Filled 2016-06-27: qty 10

## 2016-06-27 MED ORDER — LACTATED RINGERS IV SOLN
INTRAVENOUS | Status: DC
  Administered 2016-06-27: 12:00:00 via INTRAVENOUS

## 2016-06-27 MED ORDER — OXYCODONE-ACETAMINOPHEN 5-325 MG PO TABS: 1.0000 | ORAL_TABLET | ORAL | Status: DC | PRN

## 2016-06-27 MED ORDER — DIPHENHYDRAMINE HCL 50 MG/ML IJ SOLN: 12.5000 mg | INTRAMUSCULAR | Status: DC | PRN

## 2016-06-27 MED ORDER — LACTATED RINGERS IV SOLN
500.0000 mL | INTRAVENOUS | Status: DC | PRN
  Administered 2016-06-27: 1000 mL via INTRAVENOUS
  Administered 2016-06-27: 500 mL via INTRAVENOUS

## 2016-06-27 MED ORDER — SOD CITRATE-CITRIC ACID 500-334 MG/5ML PO SOLN: 30.0000 mL | ORAL | Status: DC | PRN

## 2016-06-27 MED ORDER — OXYTOCIN BOLUS FROM INFUSION
500.0000 mL | Freq: Once | INTRAVENOUS | Status: AC
  Administered 2016-06-27: 500 mL via INTRAVENOUS

## 2016-06-27 MED ORDER — TERBUTALINE SULFATE 1 MG/ML IJ SOLN
0.2500 mg | Freq: Once | INTRAMUSCULAR | Status: DC | PRN
Start: 2016-06-27 — End: 2016-06-29
  Filled 2016-06-27: qty 1

## 2016-06-27 MED ORDER — ONDANSETRON HCL 4 MG/2ML IJ SOLN: 4.0000 mg | Freq: Four times a day (QID) | INTRAMUSCULAR | Status: DC | PRN

## 2016-06-27 MED ORDER — EPHEDRINE 5 MG/ML INJ
10.0000 mg | INTRAVENOUS | Status: DC | PRN
  Filled 2016-06-27: qty 4

## 2016-06-27 MED ORDER — FENTANYL 2.5 MCG/ML BUPIVACAINE 1/10 % EPIDURAL INFUSION (WH - ANES)
14.0000 mL/h | INTRAMUSCULAR | Status: DC | PRN
  Administered 2016-06-27: 14 mL/h via EPIDURAL
  Filled 2016-06-27: qty 100

## 2016-06-27 MED ORDER — LACTATED RINGERS IV SOLN: 500.0000 mL | Freq: Once | INTRAVENOUS | Status: AC

## 2016-06-27 MED ORDER — LIDOCAINE HCL (PF) 1 % IJ SOLN
30.0000 mL | INTRAMUSCULAR | Status: DC | PRN
Start: 2016-06-27 — End: 2016-06-29
  Filled 2016-06-27: qty 30

## 2016-06-27 MED ORDER — PHENYLEPHRINE 40 MCG/ML (10ML) SYRINGE FOR IV PUSH (FOR BLOOD PRESSURE SUPPORT)
80.0000 ug | PREFILLED_SYRINGE | INTRAVENOUS | Status: DC | PRN
  Administered 2016-06-27 (×2): 80 ug via INTRAVENOUS
  Filled 2016-06-27: qty 5
  Filled 2016-06-27: qty 10

## 2016-06-27 MED ORDER — LIDOCAINE-EPINEPHRINE (PF) 2 %-1:200000 IJ SOLN
INTRAMUSCULAR | Status: DC | PRN
  Administered 2016-06-27: 3 mL

## 2016-06-27 MED ORDER — BUPIVACAINE HCL (PF) 0.25 % IJ SOLN
INTRAMUSCULAR | Status: DC | PRN
  Administered 2016-06-27 (×2): 4 mL via EPIDURAL

## 2016-06-27 MED ORDER — ACETAMINOPHEN 325 MG PO TABS: 650.0000 mg | ORAL_TABLET | ORAL | Status: DC | PRN

## 2016-06-27 MED ORDER — OXYTOCIN 40 UNITS IN LACTATED RINGERS INFUSION - SIMPLE MED: 2.5000 [IU]/h | INTRAVENOUS | Status: DC

## 2016-06-27 MED ORDER — OXYTOCIN 40 UNITS IN LACTATED RINGERS INFUSION - SIMPLE MED
1.0000 m[IU]/min | INTRAVENOUS | Status: DC
  Administered 2016-06-27: 2 m[IU]/min via INTRAVENOUS
  Filled 2016-06-27: qty 1000

## 2016-06-27 NOTE — Anesthesia Procedure Notes (Signed)
Epidural Patient location during procedure: OB  Staffing Anesthesiologist: Val EagleMOSER, Richey Doolittle Performed: anesthesiologist   Preanesthetic Checklist Completed: patient identified, surgical consent, pre-op evaluation, timeout performed, IV checked, risks and benefits discussed and monitors and equipment checked  Epidural Patient position: sitting Prep: DuraPrep Patient monitoring: heart rate, cardiac monitor, continuous pulse ox and blood pressure Approach: midline Location: L3-L4 Injection technique: LOR saline  Needle:  Needle type: Tuohy  Needle gauge: 17 G Needle length: 9 cm Needle insertion depth: 8 cm Catheter type: closed end flexible Catheter size: 19 Gauge Catheter at skin depth: 14 cm Test dose: negative and 2% lidocaine with Epi 1:200 K  Assessment Events: blood not aspirated, injection not painful, no injection resistance, negative IV test and no paresthesia  Additional Notes Reason for block:procedure for pain
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
## 2016-06-27 NOTE — Progress Notes (Signed)
Patient ID: [name], female   DOB: 11/04/1995, 20 y.o.   MRN: [id number] Pt comfortable with epidural Ant lip noted on exam per nurse at last check  Cat 1 strip   Will check in 15mins and push if able otherwise will labor down  Anticipate svd

## 2016-06-27 NOTE — Anesthesia Preprocedure Evaluation (Signed)
Anesthesia Evaluation  Patient identified by MRN, date of birth, ID band Patient awake    Reviewed: Allergy & Precautions, NPO status , Patient's Chart, lab work & pertinent test results  History of Anesthesia Complications Negative for: history of anesthetic complications  Airway Mallampati: II  TM Distance: >3 FB Neck ROM: Full    Dental  (+) Teeth Intact   Pulmonary former smoker,    breath sounds clear to auscultation       Cardiovascular negative cardio ROS   Rhythm:Regular     Neuro/Psych PSYCHIATRIC DISORDERS Anxiety Depression negative neurological ROS     GI/Hepatic negative GI ROS, Neg liver ROS,   Endo/Other  negative endocrine ROS  Renal/GU negative Renal ROS     Musculoskeletal   Abdominal   Peds  Hematology negative hematology ROS (+)   Anesthesia Other Findings   Reproductive/Obstetrics (+) Pregnancy                             Anesthesia Physical Anesthesia Plan  ASA: II  Anesthesia Plan: Epidural   Post-op Pain Management:    Induction:   Airway Management Planned:   Additional Equipment:   Intra-op Plan:   Post-operative Plan:   Informed Consent: I have reviewed the patients History and Physical, chart, labs and discussed the procedure including the risks, benefits and alternatives for the proposed anesthesia with the patient or authorized representative who has indicated his/her understanding and acceptance.   Dental advisory given  Plan Discussed with: Anesthesiologist  Anesthesia Plan Comments:         Anesthesia Quick Evaluation

## 2016-06-27 NOTE — Progress Notes (Signed)
Patient ID: [name], female   DOB: 05/14/1996, 20 y.o.   MRN: [id number] Pt reports worsening discomfort with contractions. Rates at 5/10. Requests epidural EFM - 140s, cat 1 TOCO - ctxs q 1-53mins SVE 3/100/-1  A/P: Prime at 38 0/7wks with SROM progressing in labor on pitocin        Pt may have epidural        Recheck in two hours or prn       Anticipate svd

## 2016-06-27 NOTE — Anesthesia Pain Management Evaluation Note (Signed)
  CRNA Pain Management Visit Note  Patient: [name], 20 y.o., female  "Hello I am a member of the anesthesia team at [facility]. We have an anesthesia team available at all times to provide care throughout the hospital, including epidural management and anesthesia for C-section. I don't know your plan for the delivery whether it a natural birth, water birth, IV sedation, nitrous supplementation, doula or epidural, but we want to meet your pain goals."   1.Was your pain managed to your expectations on prior hospitalizations?   No prior hospitalizations  2.What is your expectation for pain management during this hospitalization?     Epidural  3.How can we help you reach that goal?   Record the patient's initial score and the patient's pain goal.   Pain: 5  Pain Goal: 5 [facility] wants you to be able to say your pain was always managed very well.  [name],[name] 06/27/2016

## 2016-06-28 LAB — RPR: RPR: NONREACTIVE

## 2016-06-28 LAB — CBC
HCT: 27.8 % — ABNORMAL LOW (ref 36.0–46.0)
HEMOGLOBIN: 10 g/dL — AB (ref 12.0–15.0)
MCH: 32.6 pg (ref 26.0–34.0)
MCHC: 36 g/dL (ref 30.0–36.0)
MCV: 90.6 fL (ref 78.0–100.0)
PLATELETS: 163 10*3/uL (ref 150–400)
RBC: 3.07 MIL/uL — AB (ref 3.87–5.11)
RDW: 13 % (ref 11.5–15.5)
WBC: 11.2 10*3/uL — AB (ref 4.0–10.5)

## 2016-06-28 MED ORDER — BENZOCAINE-MENTHOL 20-0.5 % EX AERO: 1.0000 "application " | INHALATION_SPRAY | CUTANEOUS | Status: DC | PRN

## 2016-06-28 MED ORDER — PRENATAL MULTIVITAMIN CH
1.0000 | ORAL_TABLET | Freq: Every day | ORAL | Status: DC
  Administered 2016-06-28 – 2016-06-29 (×2): 1 via ORAL
  Filled 2016-06-28 (×2): qty 1

## 2016-06-28 MED ORDER — ONDANSETRON HCL 4 MG PO TABS
4.0000 mg | ORAL_TABLET | ORAL | Status: DC | PRN
Start: 2016-06-28 — End: 2016-06-29

## 2016-06-28 MED ORDER — DIPHENHYDRAMINE HCL 25 MG PO CAPS: 25.0000 mg | ORAL_CAPSULE | Freq: Four times a day (QID) | ORAL | Status: DC | PRN

## 2016-06-28 MED ORDER — TETANUS-DIPHTH-ACELL PERTUSSIS 5-2.5-18.5 LF-MCG/0.5 IM SUSP: 0.5000 mL | Freq: Once | INTRAMUSCULAR | Status: DC

## 2016-06-28 MED ORDER — WITCH HAZEL-GLYCERIN EX PADS
1.0000 "application " | MEDICATED_PAD | CUTANEOUS | Status: DC | PRN
  Administered 2016-06-28: 1 via TOPICAL

## 2016-06-28 MED ORDER — DIBUCAINE 1 % RE OINT
1.0000 "application " | TOPICAL_OINTMENT | RECTAL | Status: DC | PRN
  Administered 2016-06-28: 1 via RECTAL
  Filled 2016-06-28: qty 28

## 2016-06-28 MED ORDER — ZOLPIDEM TARTRATE 5 MG PO TABS: 5.0000 mg | ORAL_TABLET | Freq: Every evening | ORAL | Status: DC | PRN

## 2016-06-28 MED ORDER — SENNOSIDES-DOCUSATE SODIUM 8.6-50 MG PO TABS
2.0000 | ORAL_TABLET | ORAL | Status: DC
  Administered 2016-06-28: 2 via ORAL
  Filled 2016-06-28 (×2): qty 2

## 2016-06-28 MED ORDER — SIMETHICONE 80 MG PO CHEW: 80.0000 mg | CHEWABLE_TABLET | ORAL | Status: DC | PRN

## 2016-06-28 MED ORDER — IBUPROFEN 600 MG PO TABS
600.0000 mg | ORAL_TABLET | Freq: Four times a day (QID) | ORAL | Status: DC
  Administered 2016-06-28 – 2016-06-29 (×6): 600 mg via ORAL
  Filled 2016-06-28 (×7): qty 1

## 2016-06-28 MED ORDER — COCONUT OIL OIL
1.0000 "application " | TOPICAL_OIL | Status: DC | PRN
  Filled 2016-06-28: qty 120

## 2016-06-28 MED ORDER — ACETAMINOPHEN 325 MG PO TABS: 650.0000 mg | ORAL_TABLET | ORAL | Status: DC | PRN

## 2016-06-28 MED ORDER — ONDANSETRON HCL 4 MG/2ML IJ SOLN: 4.0000 mg | INTRAMUSCULAR | Status: DC | PRN

## 2016-06-28 NOTE — Lactation Note (Signed)
This note was copied from a baby's chart. Lactation Consultation Note  Patient Name: Carol Bertrum SolKsenia Holt RUEAV'WToday's Date: 06/28/2016 Reason for consult: Initial assessment  Initial consult at 23 hrs old; GA 38.0; BW 6 lbs, 5 oz.  Mom is P1; History of multiple drug use and ETOH abuse; PTSD.   Infant has breastfed x7 (15-30 min) + EBM spoon x1 (3 ml) + attempts x2 (0-5 min); voids-4 since birth; stools-3 since birth.  LS 6-7 by RN.   Infant showing feeding cues upon entering room.  Mom stated she had just fed for 30 minutes.  LC reviewed cluster feeding and encoruaged mom to relatch infant. RN observed infant has a short frenulum.  Upon assessment with a gloved finger observed a thin frenulum under tongue, but when sucking on gloved finger infant had good intraoral pressure and was able to maintain latch on finger with wavelike motion and consistent contact with finger.  When crying tongue is flat not bowl shaped and infant can extend to gum line.   Reviewed hand expression with return demonstration and observation of colostrum pouring from nipple.  Mom has a good milk supply.   Assistance needed with proper positioing of infant for feeding in cross-cradle hold on right side.  Taught sandwiching of breast and flanging of lips to assure a deep latch and milk transfer.  Infant fed for 5 minutes and then came off, mom changed diaper, and then infant re-latched to same side (right breast).  Lots swallows heard with sucking and with compressions at breast (observed good flow of colostrum with hand expression and frequent swallows at breast).  Clicking noted especially with increased milk flow (noted by increased swallows), but minimized with deep latch, bringing baby in closer to mom, and flanging of bottom lip.   Educated about size of infant's stomach, cluster feeding, and feeding with cues.  Encouraged to spoon feed extra EBM as needed.   Lactation brochure given and informed of hospital support group and  outpatient services. Encouraged to call for assistance as needed.   Report given to RN.  Told RN about observation and to encouraged hand expression and spoon feeding after breastfeeding if infant is not satisfied after breastfeeding.      Maternal Data Does the patient have breastfeeding experience prior to this delivery?: No  Feeding Feeding Type: Breast Fed  LATCH Score/Interventions Latch: Grasps breast easily, tongue down, lips flanged, rhythmical sucking. Intervention(s): Breast compression;Assist with latch;Adjust position  Audible Swallowing: Spontaneous and intermittent  Type of Nipple: Everted at rest and after stimulation  Comfort (Breast/Nipple): Filling, red/small blisters or bruises, mild/mod discomfort  Problem noted: Mild/Moderate discomfort Interventions (Mild/moderate discomfort):  (Coconut oil at bedside; encouarged use.)  Hold (Positioning): Assistance needed to correctly position infant at breast and maintain latch.  LATCH Score: 8  Lactation Tools Discussed/Used WIC Program: No   Consult Status Consult Status: Follow-up Date: 06/29/16 Follow-up type: In-patient    [name], [name] 06/28/2016, 10:04 PM

## 2016-06-28 NOTE — Progress Notes (Deleted)
Patient ID: [name], female   DOB: 06/14/1996, 20 y.o.   MRN: [id number] POD#1 Pt doing well. Pain well controlled, lochia mild, tolerating po and voiding well. Denies any fever or chills.Has no complaints. Bonding well with baby - working on breastfeeding VSS- afeb ABD -  soft, ND, dressing c/d/i EXT - +1 edema b/a, no homans  Hg - 7.1 ( from 10)  A/P: POD # 1 s/p pltc/s for maternal temp remote from delivery; stable         Continue on amp/gent x 48hours from delivery         Routine pp/post op care

## 2016-06-28 NOTE — Progress Notes (Signed)
Patient ID: [name], female   DOB: 09/10/1995, 20 y.o.   MRN: [id number]  PPD#1  Pt doing well. Lochia mild, pain controlled, voiding well. Denies any fever or chills. Bonding well with baby- breastfeeding. Denies any depression or anxiety; family present and supportive VSS ABD- soft, ND, NTTP EXT - no homans  A/P: PPD# 1 S/p svd- stable         Routine pp care         Desires discharge to home in am tomorrow         Will do circ in office

## 2016-06-28 NOTE — Anesthesia Postprocedure Evaluation (Signed)
Anesthesia Post Note  Patient: [name]  Procedure(s) Performed: * No procedures listed *  Patient location during evaluation: Mother Baby Anesthesia Type: Epidural Level of consciousness: awake and alert Pain management: satisfactory to patient Vital Signs Assessment: post-procedure vital signs reviewed and stable Respiratory status: respiratory function stable Cardiovascular status: stable Postop Assessment: no headache, no backache, epidural receding, patient able to bend at knees, no signs of nausea or vomiting and adequate PO intake Anesthetic complications: no     Last Vitals:  Vitals:   06/28/16 0531 06/28/16 0824  BP: 124/63 120/60  Pulse: 76 63  Resp: 16 18  Temp: 36.8 C 36.8 C    Last Pain:  Vitals:   06/28/16 0824  TempSrc: Oral  PainSc: 0-No pain   Pain Goal:                 [name]

## 2016-06-29 MED ORDER — IBUPROFEN 600 MG PO TABS: 600.0000 mg | ORAL_TABLET | Freq: Four times a day (QID) | ORAL | 1 refills | Status: DC | PRN

## 2016-06-29 MED ORDER — SUCROSE 24% NICU/PEDS ORAL SOLUTION
OROMUCOSAL | Status: AC
  Filled 2016-06-29: qty 0.5

## 2016-06-29 NOTE — Clinical Social Work Maternal (Signed)
CLINICAL SOCIAL WORK MATERNAL/CHILD NOTE  Patient Details  Name: [name] MRN: [id number] Date of Birth: 11/24/1995  Date:  August 06, 2016  Clinical Social Worker Initiating Note:  Ferdinand Lango Corisa Montini, MSW, LCSW-A   Date/ Time Initiated:  06/29/16/1035              Child's Name:  [name]    Legal Guardian:  Other (Comment) (Not established by court system; MOB and FOB parent collectively. )   Need for Interpreter:  None   Date of Referral:  2016-04-14     Reason for Referral:  Current Substance Use/Substance Use During Pregnancy , Other (Comment) (MOB hx of anxiety, depression and self harm )   Referral Source:  Physician   Address:  [address], [place] [address]  Phone number:  [phone]   Household Members: Self, Spouse   Natural Supports (not living in the home): Parent, Immediate Family, Friends, Extended Family   Professional Supports:Other (Comment) ([facility] )   Employment:Unemployed   Type of Work: Unemployed    Education:  9 to 11 years   Financial Resources:Medicaid   Other Resources: [employer], Physicist, medical    Cultural/Religious Considerations Which May Impact Care: None reported at this time.   Strengths: Ability to meet basic needs , Compliance with medical plan , Home prepared for child , Pediatrician chosen  [facility] )   Risk Factors/Current Problems: Substance Use , Mental Health Concerns    Cognitive State: Alert , Goal Oriented , Insightful , Able to Concentrate    Mood/Affect: Comfortable , Calm , Interested    CSW Assessment:CSW met with MOB at bedside to complete assessment. At this time, MOB was accompanied by her husband/FOB [name]. MOB was holding baby in bed while FOB was catering to there needs. With MOB's permission, this writer informed her of this writers role and reasoning of visit being due to her hx of substance, anxiety, depression and self harm. MOB  acknowledged her hx of all of these; however, noted she is no longer dealing with those issues. "I have made a complete 360 turn around. I have been clean for several months-a year, I am married to someone who also is in recovery and my mom and her [place] friends are very supportive of me and baby at this time". This Probation officer praised MOB for her resilience and discussed resources that are available should she feel she is regressing. MOB was thankful for this. This Probation officer discussed hospitals policy and procedure regarding substance with MOB. Additionally, this Probation officer discussed and gave preventive measures for PPD and SIDS. MOB and FOB both verbalize understanding.   This Probation officer assessed MOB for additional psycho-social support needs. At this time, MOB notes she has no additional needs. She notes she feels great and has a great support system. At this time, no other needs were addressed or requested thus CSW will continue to follow pending cord blood test results.   CSW Plan/Description: No Further Intervention Required/No Barriers to Discharge, Other (Comment) (CSW will continue to follow pending cord blood test results )    Ferdinand Lango Laurianne Floresca, MSW, [facility]  Office: [phone]

## 2016-06-29 NOTE — Progress Notes (Signed)
Patient ID: [name], female   DOB: 11/12/1995, 20 y.o.   MRN: [id number] Pt feels well rested after some sleep. Still working on feeding but bonding well with baby. Plans to room in tonight. No fever or chilsl and pain well controlled. Lochia mild VSS ABD- FF EXT - no homans  A/P: PPD#2 s/p svd - stable         Discharge to home today         Plans to get nexplanon for bc         Will schedule circ in office

## 2016-06-29 NOTE — Lactation Note (Addendum)
This note was copied from a baby's chart. Lactation Consultation Note  Infant having blood in stools since mother had large milk shake last night at approx 2030.  Stool had approx 0100 was large stool with blood. Discussed The Academy of Breastfeeding Medicine "Clinical Protocol #24: Allergic Proctocolitis in the Exclusively Breastfed Infant" with Eulah PontMurphy MD in NICU. MD in agreement that infant at this time can continue breastfeeding while mother follows elimination diet of dairy and eggs to see if bloody stools improve. ABM Protocol states bloody stools usually clear within 72-96 hours after elimination.  If not, Protocol states for mother to eliminate other food groups such as soy if mother is willing. ABM Protocol states for mother to continue breastfeeding. Mother's nipples have cracks but mother states her nipples have not been bleeding. Baby latched easily.  Mother's pain improved after a few minutes of breastfeeding. Sucks and swallows observed. Discussed pumping options if needed. Mother happy and really wants to breastfeed her baby.  Spoke with Dr. Manson PasseyBrown Pediatrician and gave her copy of ABM Protocol #24.  She was in agreement regarding continuing to breastfeed.  Reported to Joni ReiningNicole RN in nursery that NICU MD is allowing infant to continue to breastfeed and mother will follow elimination diet.   Patient Name: Boy Bertrum SolKsenia Holt NWGNF'AToday's Date: 06/29/2016 Reason for consult: Follow-up assessment   Maternal Data    Feeding Feeding Type: Breast Fed  LATCH Score/Interventions Latch: Grasps breast easily, tongue down, lips flanged, rhythmical sucking.  Audible Swallowing: Spontaneous and intermittent Intervention(s): Skin to skin  Type of Nipple: Everted at rest and after stimulation  Comfort (Breast/Nipple): Filling, red/small blisters or bruises, mild/mod discomfort  Problem noted: Mild/Moderate discomfort  Hold (Positioning): No assistance needed to correctly position infant  at breast.  LATCH Score: 9  Lactation Tools Discussed/Used     Consult Status Consult Status: Follow-up Date: 06/30/16 Follow-up type: In-patient    Dahlia ByesBerkelhammer, Juleon Narang [facility] 06/29/2016, 11:08 AM

## 2016-06-29 NOTE — Clinical Social Work Maternal (Deleted)
CLINICAL SOCIAL WORK MATERNAL/CHILD NOTE  Patient Details  Name: [name] MRN: [id number] Date of Birth: 1996-08-02  Date:  06/29/2016  Clinical Social Worker Initiating Note:  Ferdinand Lango , MSW, LCSW-A  Date/ Time Initiated:  06/29/16/1035     Child's Name:  [name]    Legal Guardian:  Other (Comment) (Not established by court system; MOB and FOB parent collectively. )   Need for Interpreter:  None   Date of Referral:  06/27/16     Reason for Referral:  Current Substance Use/Substance Use During Pregnancy , Other (Comment) (MOB hx of anxiety, depression and self harm )   Referral Source:  Physician   Address:  [place], [place] [address]  Phone number:  [phone]   Household Members:  Self, Spouse   Natural Supports (not living in the home):  Parent, Immediate Family, Friends, Extended Family   Professional Supports: Other (Comment) ([place] )   Employment: Unemployed   Type of Work: Unemployed    Education:  9 to 11 years   Museum/gallery curator Resources:  Medicaid   Other Resources:  [employer], Physicist, medical    Cultural/Religious Considerations Which May Impact Care:  None reported at this time.   Strengths:      Risk Factors/Current Problems:  Substance Use , Mental Health Concerns    Cognitive State:  Alert , Goal Oriented , Insightful , Able to Concentrate    Mood/Affect:  Comfortable , Calm , Interested    CSW Assessment: CSW met with MOB at bedside to complete assessment. At this time, MOB was accompanied by her husband/FOB [name]. MOB was holding baby in bed while FOB was catering to there needs. With MOB's permission, this writer informed her of this writers role and reasoning of visit being due to her hx of substance, anxiety, depression and self harm. MOB acknowledged her hx of all of these; however, noted she is no longer dealing with those issues. "I have made a complete 360 turn around. 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 This Probation officer praised MOB for her resilience and discussed resources that are available should she feel she is regressing. MOB was thankful for this. This Probation officer discussed hospitals policy and procedure regarding substance with MOB. Additionally, this Probation officer discussed and gave preventive measures for PPD and SIDS. MOB and FOB both verbalize understanding.   This Probation officer assessed MOB for additional psycho-social support needs. At this time, MOB notes she has no additional needs. She notes she feels great and has a great support system. At this time, no other needs were addressed or requested thus CSW will continue to follow pending cord blood test results.   CSW Plan/Description:  No Further Intervention Required/No Barriers to Discharge, Other (Comment) (CSW will continue to follow pending cord blood test results )    Ferdinand Lango , MSW, [facility]  Office: [phone]  CLINICAL SOCIAL WORK MATERNAL/CHILD NOTE  Patient Details  Name: [name] MRN: [id number] Date of Birth: 1996-08-02  Date:  06/29/2016  Clinical Social Worker Initiating Note:  Ferdinand Lango , MSW, LCSW-A  Date/ Time Initiated:  06/29/16/1035     Child's Name:  [name]    Legal Guardian:  Other (Comment) (Not established by court system; MOB and FOB parent collectively. )   Need for Interpreter:  None   Date of Referral:  06/27/16     Reason for Referral:  Current Substance Use/Substance Use During Pregnancy , Other (Comment) (MOB hx of anxiety, depression and self harm )   Referral Source:  Physician   Address:  [place], [place] [address]  Phone number:  [phone]   Household Members:  Self, Spouse   Natural Supports (not living in the home):  Parent, Immediate Family, Friends, Extended Family   Professional Supports: Other (Comment) ([place] )   Employment: Unemployed   Type of Work: Unemployed    Education:  9 to 11 years   Museum/gallery curator Resources:  Medicaid   Other Resources:  [employer], Physicist, medical    Cultural/Religious Considerations Which May Impact Care:  None reported at this time.   Strengths:      Risk Factors/Current Problems:  Substance Use , Mental Health Concerns    Cognitive State:  Alert , Goal Oriented , Insightful , Able to Concentrate    Mood/Affect:  Comfortable , Calm , Interested    CSW Assessment: CSW met with MOB at bedside to complete assessment. At this time, MOB was accompanied by her husband/FOB [name]. MOB was holding baby in bed while FOB was catering to there needs. With MOB's permission, this writer informed her of this writers role and reasoning of visit being due to her hx of substance, anxiety, depression and self harm. MOB acknowledged her hx of all of these; however, noted she is no longer dealing with those issues. "I have made a complete 360 turn around. 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 This Probation officer praised MOB for her resilience and discussed resources that are available should she feel she is regressing. MOB was thankful for this. This Probation officer discussed hospitals policy and procedure regarding substance with MOB. Additionally, this Probation officer discussed and gave preventive measures for PPD and SIDS. MOB and FOB both verbalize understanding.   This Probation officer assessed MOB for additional psycho-social support needs. At this time, MOB notes she has no additional needs. She notes she feels great and has a great support system. At this time, no other needs were addressed or requested thus CSW will continue to follow pending cord blood test results.   CSW Plan/Description:  No Further Intervention Required/No Barriers to Discharge, Other (Comment) (CSW will continue to follow pending cord blood test results )    Ferdinand Lango , MSW, [facility]  Office: [phone]

## 2016-06-29 NOTE — Discharge Summary (Signed)
OB Discharge Summary     Patient Name: [name] DOB: [birth year] MRN: [id number]  Date of admission: 06/27/2016 Delivering MD: Pryor Ochoa [facility]   Date of discharge: 06/29/2016  Admitting diagnosis: ROM Intrauterine pregnancy: [redacted]w[redacted]d     Secondary diagnosis:  Active Problems:   Delayed delivery after SROM (spontaneous rupture of membranes)   SVD (spontaneous vaginal delivery)   Postpartum care following vaginal delivery  Additional problems: none     Discharge diagnosis: Term Pregnancy Delivered                                                                                                Post partum procedures:none  Augmentation: Pitocin  Complications: None  Hospital course:  Onset of Labor With Vaginal Delivery     20 y.o. yo G1P1001 at [redacted]w[redacted]d was admitted in Latent Labor on 06/27/2016. Patient had an uncomplicated labor course as follows:  Membrane Rupture Time/Date: 6:15 AM ,06/27/2016   Intrapartum Procedures: Episiotomy: None [1]                                         Lacerations:  Vaginal [6];1st degree [2]  Patient had a delivery of a Viable infant. 06/27/2016  Information for the patient's newborn:  Donelle, Balka [[id number]]  Delivery Method: Vaginal, Spontaneous Delivery (Filed from Delivery Summary)    Pateint had an uncomplicated postpartum course.  She is ambulating, tolerating a regular diet, passing flatus, and urinating well. Patient is discharged home in stable condition on 06/29/16.    Physical exam Vitals:   06/28/16 0531 06/28/16 0824 06/28/16 1825 06/29/16 0520  BP: 124/63 120/60 120/66 116/62  Pulse: 76 63 75 67  Resp: 16 18 18 18   Temp: 98.3 F (36.8 C) 98.2 F (36.8 C) 98.5 F (36.9 C) 98.3 F (36.8 C)  TempSrc: Oral Oral Oral Oral  SpO2:      Weight:      Height:       General: alert, cooperative and no distress Lochia: appropriate Uterine Fundus: firm Incision: N/A DVT Evaluation: No evidence of DVT seen on physical  exam. Labs: Lab Results  Component Value Date   WBC 11.2 (H) 06/28/2016   HGB 10.0 (L) 06/28/2016   HCT 27.8 (L) 06/28/2016   MCV 90.6 06/28/2016   PLT 163 06/28/2016   CMP Latest Ref Rng & Units 09/06/2015  Glucose 65 - 99 mg/dL 92  BUN 6 - 20 mg/dL 11  Creatinine 7.82 - 9.56 mg/dL 2.13  Sodium 086 - 578 mmol/L 139  Potassium 3.5 - 5.1 mmol/L 3.7  Chloride 101 - 111 mmol/L 104  CO2 22 - 32 mmol/L 23  Calcium 8.9 - 10.3 mg/dL 9.2  Total Protein 6.5 - 8.1 g/dL 7.2  Total Bilirubin 0.3 - 1.2 mg/dL 0.3  Alkaline Phos 38 - 126 U/L 69  AST 15 - 41 U/L 21  ALT 14 - 54 U/L 17    Discharge instruction: per After Visit Summary and "Baby and Me  Booklet".  After visit meds:    Medication List    STOP taking these medications   gabapentin 300 MG capsule Commonly known as:  NEURONTIN   prazosin 2 MG capsule Commonly known as:  MINIPRESS     TAKE these medications   acetaminophen 500 MG tablet Commonly known as:  TYLENOL Take 1,000 mg by mouth every 6 (six) hours as needed for mild pain or headache.   ibuprofen 600 MG tablet Commonly known as:  ADVIL,MOTRIN Take 1 tablet (600 mg total) by mouth every 6 (six) hours as needed for headache, moderate pain or cramping.   prenatal multivitamin Tabs tablet Take 1 tablet by mouth daily at 12 noon.   sertraline 50 MG tablet Commonly known as:  ZOLOFT Take 1 tablet (50 mg total) by mouth daily.       Diet: routine diet  Activity: Advance as tolerated. Pelvic rest for 6 weeks.   Outpatient follow up:6 weeks Follow up Appt:No future appointments. Follow up Visit:No Follow-up on file.  Postpartum contraception: Nexplanon  Newborn Data: Live born female  Birth Weight: 6 lb 5.1 oz (2865 g) APGAR: 9, 9  Baby Feeding: Breast Disposition:rooming in   06/29/2016 [facility], DO

## 2016-06-29 NOTE — Discharge Instructions (Signed)
Nothing in vagina for 6 weeks.  No sex, tampons, and douching.  Other instructions as in [facility] Discharge Booklet. °

## 2016-06-29 NOTE — Progress Notes (Signed)
Patient ID: [name], female   DOB: 11/13/1995, 20 y.o.   MRN: [id number] PPD#2 Pt sleeping finally; had been a little fatigued and overwhelmed due to lack of sleep per FOB. No complaints other than that. Still bonding well Baby with blood in stool; feeding issues - not likely to be discharged to home today  VSS  A/P: PPD# 2 S/p svd - stable         Routine pp care         Likely discdarge to home tomorrow

## 2016-06-30 ENCOUNTER — Ambulatory Visit: Payer: Self-pay

## 2016-06-30 NOTE — Lactation Note (Addendum)
This note was copied from a baby's chart. Lactation Consultation Note  Patient Name: [name] SolKsenia Holt AVWUJ'WToday's Date: 06/30/2016 Reason for consult: Follow-up assessment  Baby 60 hours old. Mom states that the baby is latching better now and she is also getting lots of EBM. Mom has about 3 ounces of transitional milk that she has just pumped at the bedside. Mom reports that she is nursing the baby every-other-feed, and then supplementing with EBM in between breastfeeds. Enc mom to put baby to breast first with each feeding and then supplement afterwards. Enc mom to post-pump after each feeding as well until she is sure that the baby is nursing well enough at the breast to exclusively breastfeed. Discussed ways of knowing that baby getting enough at the breast, and how to gradually move away from supplementing. Mom reports that she has a DEBP at [facility]. Enc mom to take tubing with her when she is discharged.  Mom states that her nipples are sore, and upon examination they are red/abraded. Mom given comfort gels with review. Mom aware of OP/BFSG and LC phone line assistance after D/C.    Maternal Data    Feeding Feeding Type: Breast Milk Nipple Type: Slow - flow Length of feed: 10 min  LATCH Score/Interventions                      Lactation Tools Discussed/Used     Consult Status Consult Status: Follow-up Date: 07/01/16 Follow-up type: In-patient    Sherlyn HayJennifer D Wilder Kurowski 06/30/2016, 11:13 AM

## 2016-08-29 ENCOUNTER — Emergency Department (HOSPITAL_COMMUNITY): Payer: Medicaid Other

## 2016-08-29 ENCOUNTER — Encounter (HOSPITAL_COMMUNITY): Payer: Self-pay | Admitting: Emergency Medicine

## 2016-08-29 ENCOUNTER — Emergency Department (HOSPITAL_COMMUNITY)
Admission: EM | Admit: 2016-08-29 | Discharge: 2016-08-30 | Disposition: A | Discharge status: Home / Self Care | Payer: Medicaid Other | Attending: Emergency Medicine | Admitting: Emergency Medicine

## 2016-08-29 ENCOUNTER — Ambulatory Visit (HOSPITAL_COMMUNITY)
Admission: EM | Admit: 2016-08-29 | Discharge: 2016-08-29 | Disposition: A | Discharge status: Other Acute Inpatient Hospital | Payer: Medicaid Other

## 2016-08-29 DIAGNOSIS — I10 Essential (primary) hypertension: Secondary | ICD-10-CM | POA: Diagnosis not present

## 2016-08-29 DIAGNOSIS — R55 Syncope and collapse: Secondary | ICD-10-CM | POA: Diagnosis not present

## 2016-08-29 DIAGNOSIS — Z79899 Other long term (current) drug therapy: Secondary | ICD-10-CM | POA: Diagnosis not present

## 2016-08-29 DIAGNOSIS — F17210 Nicotine dependence, cigarettes, uncomplicated: Secondary | ICD-10-CM | POA: Insufficient documentation

## 2016-08-29 DIAGNOSIS — W19XXXA Unspecified fall, initial encounter: Secondary | ICD-10-CM

## 2016-08-29 DIAGNOSIS — R519 Headache, unspecified: Secondary | ICD-10-CM

## 2016-08-29 DIAGNOSIS — R51 Headache: Secondary | ICD-10-CM

## 2016-08-29 LAB — URINALYSIS, ROUTINE W REFLEX MICROSCOPIC
Bilirubin Urine: NEGATIVE
Glucose, UA: NEGATIVE mg/dL
Ketones, ur: NEGATIVE mg/dL
Nitrite: NEGATIVE
PROTEIN: NEGATIVE mg/dL
SPECIFIC GRAVITY, URINE: 1.003 — AB (ref 1.005–1.030)
pH: 6 (ref 5.0–8.0)

## 2016-08-29 LAB — CBC
HEMATOCRIT: 37.2 % (ref 36.0–46.0)
Hemoglobin: 12.4 g/dL (ref 12.0–15.0)
MCH: 30.6 pg (ref 26.0–34.0)
MCHC: 33.3 g/dL (ref 30.0–36.0)
MCV: 91.9 fL (ref 78.0–100.0)
PLATELETS: 315 10*3/uL (ref 150–400)
RBC: 4.05 MIL/uL (ref 3.87–5.11)
RDW: 12.6 % (ref 11.5–15.5)
WBC: 8.3 10*3/uL (ref 4.0–10.5)

## 2016-08-29 LAB — BASIC METABOLIC PANEL
Anion gap: 11 (ref 5–15)
BUN: 7 mg/dL (ref 6–20)
CO2: 22 mmol/L (ref 22–32)
CREATININE: 0.83 mg/dL (ref 0.44–1.00)
Calcium: 9.9 mg/dL (ref 8.9–10.3)
Chloride: 105 mmol/L (ref 101–111)
GFR calc Af Amer: >60 mL/min (ref >60)
GLUCOSE: 131 mg/dL — AB (ref 65–99)
POTASSIUM: 4.1 mmol/L (ref 3.5–5.1)
SODIUM: 138 mmol/L (ref 135–145)

## 2016-08-29 LAB — PREGNANCY, URINE: PREG TEST UR: NEGATIVE

## 2016-08-29 LAB — I-STAT TROPONIN, ED: Troponin i, poc: 0 ng/mL (ref 0.00–0.08)

## 2016-08-29 MED ORDER — GADOBENATE DIMEGLUMINE 529 MG/ML IV SOLN
15.0000 mL | Freq: Once | INTRAVENOUS | Status: AC | PRN
  Administered 2016-08-29: 15 mL via INTRAVENOUS

## 2016-08-29 MED ORDER — DEXAMETHASONE SODIUM PHOSPHATE 10 MG/ML IJ SOLN
10.0000 mg | Freq: Once | INTRAMUSCULAR | Status: AC
  Administered 2016-08-29: 10 mg via INTRAVENOUS
  Filled 2016-08-29: qty 1

## 2016-08-29 MED ORDER — PROCHLORPERAZINE EDISYLATE 5 MG/ML IJ SOLN
10.0000 mg | Freq: Once | INTRAMUSCULAR | Status: AC
  Administered 2016-08-29: 10 mg via INTRAVENOUS
  Filled 2016-08-29: qty 2

## 2016-08-29 MED ORDER — SODIUM CHLORIDE 0.9 % IV BOLUS (SEPSIS)
1000.0000 mL | Freq: Once | INTRAVENOUS | Status: AC
  Administered 2016-08-29: 1000 mL via INTRAVENOUS

## 2016-08-29 MED ORDER — DIPHENHYDRAMINE HCL 50 MG/ML IJ SOLN
25.0000 mg | Freq: Once | INTRAMUSCULAR | Status: AC
  Administered 2016-08-29: 25 mg via INTRAVENOUS
  Filled 2016-08-29: qty 1
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
## 2016-08-29 NOTE — ED Triage Notes (Signed)
Pt reports dizziness for the last several weeks. Pt reports 4 fall over the last 2 weeks. Per patient dizziness occurred throughout the day. Per family pt has had diarrhea and bright red bloody stools for the last 4 days. Pt reports hx of hemmrhoids. Reports giving birth 2 months. Pt reports recently having period. VSS.

## 2016-08-29 NOTE — ED Notes (Signed)
Pt transported to MRI 

## 2016-08-29 NOTE — ED Triage Notes (Signed)
Pt having lightheadedness along with vision and hearing loss along with falling down. Has happened 4 times in the past week. Along with diarrhea and blood for 3-4 days, dysuria. Had a baby 8 weeks ago. Was advised to go to the ER after speaking to Dr. Artis FlockKindl and Annette StableBill.

## 2016-08-30 DIAGNOSIS — R51 Headache: Secondary | ICD-10-CM
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
## 2016-08-30 NOTE — ED Provider Notes (Signed)
12:32 AM Patient signed out to me as pending MRI to eval for CVT.  MRI is negative.  I spoke with Dr. 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  In addition, she has this before when she was on her PTSD medication, prazosin.  She just started this medication 2 weeks ago, and again she has been passing out for the last 2 weeks.  She was strongly encouraged to call her physician so they are aware to discuss side effects of this medication as it can cause hypotension.  In addition, she has had diarrhea.  She currently feels better after headache medication.  VS remain within her normal limits and she is safe for DC.   Tomasita CrumbleAdeleke Robie Mcniel, MD 08/30/16 [phone]

## 2016-08-30 NOTE — ED Notes (Signed)
ED Provider at bedside. 

## 2016-10-20 ENCOUNTER — Encounter (HOSPITAL_COMMUNITY): Payer: Self-pay | Admitting: Family Medicine

## 2016-10-20 ENCOUNTER — Emergency Department (HOSPITAL_COMMUNITY)
Admission: EM | Admit: 2016-10-20 | Discharge: 2016-10-20 | Disposition: A | Discharge status: Home / Self Care | Payer: Medicaid Other | Attending: Emergency Medicine | Admitting: Emergency Medicine

## 2016-10-20 ENCOUNTER — Emergency Department (HOSPITAL_COMMUNITY): Payer: Medicaid Other

## 2016-10-20 DIAGNOSIS — I10 Essential (primary) hypertension: Secondary | ICD-10-CM | POA: Insufficient documentation

## 2016-10-20 DIAGNOSIS — L02411 Cutaneous abscess of right axilla: Secondary | ICD-10-CM | POA: Insufficient documentation

## 2016-10-20 DIAGNOSIS — F17210 Nicotine dependence, cigarettes, uncomplicated: Secondary | ICD-10-CM | POA: Diagnosis not present

## 2016-10-20 DIAGNOSIS — L0291 Cutaneous abscess, unspecified: Secondary | ICD-10-CM

## 2016-10-20 LAB — POC URINE PREG, ED: PREG TEST UR: NEGATIVE

## 2016-10-20 MED ORDER — LIDOCAINE-EPINEPHRINE (PF) 2 %-1:200000 IJ SOLN
10.0000 mL | Freq: Once | INTRAMUSCULAR | Status: AC
  Administered 2016-10-20: 10 mL
  Filled 2016-10-20: qty 20

## 2016-10-20 MED ORDER — SULFAMETHOXAZOLE-TRIMETHOPRIM 800-160 MG PO TABS: 1.0000 | ORAL_TABLET | Freq: Two times a day (BID) | ORAL | 0 refills | Status: AC

## 2016-10-20 MED ORDER — TETANUS-DIPHTH-ACELL PERTUSSIS 5-2.5-18.5 LF-MCG/0.5 IM SUSP
0.5000 mL | Freq: Once | INTRAMUSCULAR | Status: AC
Start: 2016-10-20 — End: 2016-10-20
  Administered 2016-10-20: 0.5 mL via INTRAMUSCULAR
  Filled 2016-10-20: qty 0.5

## 2016-10-20 MED ORDER — IBUPROFEN 800 MG PO TABS: 800.0000 mg | ORAL_TABLET | Freq: Three times a day (TID) | ORAL | 0 refills | Status: DC

## 2016-10-20 NOTE — Discharge Instructions (Signed)
Remove the bandage after 24 hours. Clean the wound and surrounding area gently with tap water and mild soap. Rinse well and blot dry. You may shower, but avoid submerging the wound, such as with a bath or swimming. Clean the wound daily to prevent reinfection. Do not use cleaners such as hydrogen peroxide or alcohol. You may use Tylenol, naproxen, or ibuprofen for pain.  You may take up to 800 mg of ibuprofen every 8 hours OR $RemoveBefor eDEID_[id number]$500mgBeforeDEID_fPoEmXWbWROzImbUMgQvhsunYAediEju$1000mg 3 days for wound check. Return sooner should there be significant increase in pain, you begin to have persistent fever, or any other significant concerns arise.

## 2016-10-20 NOTE — ED Triage Notes (Signed)
Patient is complaining of an abscess to the right arm pit that about 4 days ago. Also, complains of productive cough that started today. Pt appears in acute distress. Reports fever on Saturday night 101.0 and has been taking Tylenol for symptoms.

## 2016-10-20 NOTE — ED Notes (Signed)
ED Provider at bedside. 

## 2016-10-20 NOTE — ED Notes (Signed)
ED Provider at bedside with student for I&D.
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
## 2016-10-20 NOTE — ED Notes (Signed)
Patient was alert, oriented and stable upon discharge. RN went over AVS and patient had no further questions.  

## 2016-10-22 NOTE — ED Provider Notes (Signed)
Medical screening examination/treatment/procedure(s) were conducted as a shared visit with non-physician practitioner(s) and myself.  I personally evaluated the patient during the encounter.   EKG Interpretation None      21 y.o. female presents with abscess to right axilla. Well appearing, no systemic symptoms, drained by PA. Discussed return precautions.   EMERGENCY DEPARTMENT US SOFT TISSUE INTERPRETATION "Study: Limited Soft Tissue Ultrasound"  INDICATIONS: Soft tissue infection Multiple views of the body part were obtained in real-time with a multi-frequency linear probe  PERFORMED BY: Myself IMAGES ARCHIVED?: Yes SIDE:Right  BODY PART:Axilla INTERPRETATION:  Abcess present and No cellulitis noted   See related encounter note    Lyndal Pulleyaniel Jeannifer Drakeford, MD 10/24/16 1513

## 2017-03-11 DIAGNOSIS — H50112 Monocular exotropia, left eye: Secondary | ICD-10-CM

## 2017-03-11 DIAGNOSIS — H5010 Unspecified exotropia: Secondary | ICD-10-CM

## 2017-03-11 HISTORY — DX: Monocular exotropia, left eye: H50.112

## 2017-03-11 HISTORY — DX: Unspecified exotropia: H50.10

## 2017-04-10 ENCOUNTER — Encounter (HOSPITAL_BASED_OUTPATIENT_CLINIC_OR_DEPARTMENT_OTHER): Payer: Self-pay | Admitting: *Deleted

## 2017-04-14 ENCOUNTER — Ambulatory Visit: Payer: Self-pay | Admitting: Ophthalmology
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
## 2017-04-17 ENCOUNTER — Encounter (HOSPITAL_BASED_OUTPATIENT_CLINIC_OR_DEPARTMENT_OTHER)
Admission: RE | Disposition: A | Discharge status: Home / Self Care | Payer: Self-pay | Source: Ambulatory Visit | Attending: Ophthalmology

## 2017-04-17 ENCOUNTER — Ambulatory Visit (HOSPITAL_BASED_OUTPATIENT_CLINIC_OR_DEPARTMENT_OTHER): Payer: Medicaid Other | Admitting: Anesthesiology

## 2017-04-17 ENCOUNTER — Encounter (HOSPITAL_BASED_OUTPATIENT_CLINIC_OR_DEPARTMENT_OTHER): Payer: Self-pay | Admitting: Emergency Medicine

## 2017-04-17 ENCOUNTER — Ambulatory Visit (HOSPITAL_BASED_OUTPATIENT_CLINIC_OR_DEPARTMENT_OTHER)
Admission: RE | Admit: 2017-04-17 | Discharge: 2017-04-17 | Disposition: A | Discharge status: Home / Self Care | Payer: Medicaid Other | Source: Ambulatory Visit | Attending: Ophthalmology | Admitting: Ophthalmology

## 2017-04-17 DIAGNOSIS — H5010 Unspecified exotropia: Secondary | ICD-10-CM | POA: Insufficient documentation

## 2017-04-17 DIAGNOSIS — Z87891 Personal history of nicotine dependence: Secondary | ICD-10-CM | POA: Diagnosis not present

## 2017-04-17 DIAGNOSIS — R251 Tremor, unspecified: Secondary | ICD-10-CM | POA: Insufficient documentation

## 2017-04-17 DIAGNOSIS — F329 Major depressive disorder, single episode, unspecified: Secondary | ICD-10-CM | POA: Diagnosis not present

## 2017-04-17 DIAGNOSIS — M170 Bilateral primary osteoarthritis of knee: Secondary | ICD-10-CM | POA: Insufficient documentation

## 2017-04-17 DIAGNOSIS — F419 Anxiety disorder, unspecified: Secondary | ICD-10-CM | POA: Diagnosis not present

## 2017-04-17 HISTORY — PX: STRABISMUS SURGERY: SHX218

## 2017-04-17 HISTORY — DX: Other specified symptoms and signs involving the circulatory and respiratory systems: R09.89

## 2017-04-17 HISTORY — DX: Latent tuberculosis: Z22.7

## 2017-04-17 HISTORY — DX: Unspecified exotropia: H50.10

## 2017-04-17 HISTORY — DX: Other injury of unspecified body region, initial encounter: T14.8XXA

## 2017-04-17 HISTORY — DX: Pain disorder exclusively related to psychological factors: F45.41

## 2017-04-17 HISTORY — DX: Unspecified osteoarthritis, unspecified site: M19.90

## 2017-04-17 HISTORY — DX: Other psychoactive substance abuse, in remission: F19.11

## 2017-04-17 HISTORY — DX: Other symptoms and signs involving the musculoskeletal system: R29.898

## 2017-04-17 HISTORY — DX: Tremor, unspecified: R25.1

## 2017-04-17 SURGERY — REPAIR STRABISMUS
Anesthesia: General | Site: Eye | Laterality: Left

## 2017-04-17 MED ORDER — PROMETHAZINE HCL 25 MG/ML IJ SOLN: 6.2500 mg | INTRAMUSCULAR | Status: DC | PRN

## 2017-04-17 MED ORDER — MIDAZOLAM HCL 2 MG/2ML IJ SOLN
1.0000 mg | INTRAMUSCULAR | Status: DC | PRN
  Administered 2017-04-17: 2 mg via INTRAVENOUS

## 2017-04-17 MED ORDER — GLYCOPYRROLATE 0.2 MG/ML IJ SOLN
INTRAMUSCULAR | Status: DC | PRN
  Administered 2017-04-17: 0.3 mg via INTRAVENOUS

## 2017-04-17 MED ORDER — TOBRAMYCIN-DEXAMETHASONE 0.3-0.1 % OP OINT
TOPICAL_OINTMENT | OPHTHALMIC | Status: DC | PRN
  Administered 2017-04-17: 1 via OPHTHALMIC

## 2017-04-17 MED ORDER — DEXAMETHASONE SODIUM PHOSPHATE 10 MG/ML IJ SOLN
INTRAMUSCULAR | Status: AC
  Filled 2017-04-17: qty 1

## 2017-04-17 MED ORDER — ONDANSETRON HCL 4 MG/2ML IJ SOLN
INTRAMUSCULAR | Status: AC
  Filled 2017-04-17: qty 2

## 2017-04-17 MED ORDER — ONDANSETRON HCL 4 MG/2ML IJ SOLN
INTRAMUSCULAR | Status: DC | PRN
  Administered 2017-04-17: 4 mg via INTRAVENOUS

## 2017-04-17 MED ORDER — KETOROLAC TROMETHAMINE 30 MG/ML IJ SOLN
INTRAMUSCULAR | Status: DC | PRN
Start: 2017-04-17 — End: 2017-04-17
  Administered 2017-04-17: 30 mg via INTRAVENOUS

## 2017-04-17 MED ORDER — OXYCODONE HCL 5 MG PO TABS: 5.0000 mg | ORAL_TABLET | Freq: Once | ORAL | Status: DC | PRN

## 2017-04-17 MED ORDER — OXYCODONE HCL 5 MG/5ML PO SOLN: 5.0000 mg | Freq: Once | ORAL | Status: DC | PRN

## 2017-04-17 MED ORDER — LIDOCAINE HCL (CARDIAC) 20 MG/ML IV SOLN
INTRAVENOUS | Status: DC | PRN
  Administered 2017-04-17: 50 mg via INTRAVENOUS

## 2017-04-17 MED ORDER — LACTATED RINGERS IV SOLN
INTRAVENOUS | Status: DC
  Administered 2017-04-17: 08:00:00 via INTRAVENOUS

## 2017-04-17 MED ORDER — FENTANYL CITRATE (PF) 100 MCG/2ML IJ SOLN
INTRAMUSCULAR | Status: AC
  Filled 2017-04-17: qty 2

## 2017-04-17 MED ORDER — HYDROMORPHONE HCL 1 MG/ML IJ SOLN: 0.2500 mg | INTRAMUSCULAR | Status: DC | PRN

## 2017-04-17 MED ORDER — LIDOCAINE 2% (20 MG/ML) 5 ML SYRINGE
INTRAMUSCULAR | Status: AC
  Filled 2017-04-17: qty 5

## 2017-04-17 MED ORDER — MEPERIDINE HCL 25 MG/ML IJ SOLN: 6.2500 mg | INTRAMUSCULAR | Status: DC | PRN

## 2017-04-17 MED ORDER — KETOROLAC TROMETHAMINE 30 MG/ML IJ SOLN
INTRAMUSCULAR | Status: AC
  Filled 2017-04-17: qty 1

## 2017-04-17 MED ORDER — PHENYLEPHRINE 40 MCG/ML (10ML) SYRINGE FOR IV PUSH (FOR BLOOD PRESSURE SUPPORT)
PREFILLED_SYRINGE | INTRAVENOUS | Status: AC
  Filled 2017-04-17: qty 10

## 2017-04-17 MED ORDER — DEXAMETHASONE SODIUM PHOSPHATE 4 MG/ML IJ SOLN
INTRAMUSCULAR | Status: DC | PRN
  Administered 2017-04-17: 10 mg via INTRAVENOUS

## 2017-04-17 MED ORDER — SCOPOLAMINE 1 MG/3DAYS TD PT72: 1.0000 | MEDICATED_PATCH | Freq: Once | TRANSDERMAL | Status: DC | PRN

## 2017-04-17 MED ORDER — MIDAZOLAM HCL 2 MG/2ML IJ SOLN
INTRAMUSCULAR | Status: AC
  Filled 2017-04-17: qty 2

## 2017-04-17 MED ORDER — SUCCINYLCHOLINE CHLORIDE 200 MG/10ML IV SOSY
PREFILLED_SYRINGE | INTRAVENOUS | Status: AC
  Filled 2017-04-17: qty 10

## 2017-04-17 MED ORDER — EPHEDRINE 5 MG/ML INJ
INTRAVENOUS | Status: AC
  Filled 2017-04-17: qty 10

## 2017-04-17 MED ORDER — PREDNISONE 20 MG PO TABS: 40.0000 mg | ORAL_TABLET | Freq: Every day | ORAL | 0 refills | Status: DC

## 2017-04-17 MED ORDER — FENTANYL CITRATE (PF) 100 MCG/2ML IJ SOLN
50.0000 ug | INTRAMUSCULAR | Status: DC | PRN
  Administered 2017-04-17: 25 ug via INTRAVENOUS
  Administered 2017-04-17: 100 ug via INTRAVENOUS

## 2017-04-17 MED ORDER — TOBRAMYCIN-DEXAMETHASONE 0.3-0.1 % OP OINT: 1.0000 "application " | TOPICAL_OINTMENT | Freq: Two times a day (BID) | OPHTHALMIC | 0 refills | Status: DC

## 2017-04-17 SURGICAL SUPPLY — 31 items
APPLICATOR COTTON TIP 6IN STRL (MISCELLANEOUS) ×12 IMPLANT
APPLICATOR DR MATTHEWS STRL (MISCELLANEOUS) ×3 IMPLANT
BANDAGE EYE OVAL (MISCELLANEOUS) IMPLANT
CAUTERY EYE LOW TEMP 1300F FIN (OPHTHALMIC RELATED) IMPLANT
CLOSURE WOUND 1/4X4 (GAUZE/BANDAGES/DRESSINGS)
COVER BACK TABLE 60X90IN (DRAPES) ×3 IMPLANT
COVER MAYO STAND STRL (DRAPES) ×3 IMPLANT
DRAPE SURG 17X23 STRL (DRAPES) ×6 IMPLANT
DRAPE U-SHAPE 76X120 STRL (DRAPES) ×3 IMPLANT
GLOVE BIO SURGEON STRL SZ 6.5 (GLOVE) ×4 IMPLANT
GLOVE BIO SURGEON STRL SZ7 (GLOVE) ×3 IMPLANT
GLOVE BIO SURGEONS STRL SZ 6.5 (GLOVE) ×2
GLOVE BIOGEL M STRL SZ7.5 (GLOVE) ×3 IMPLANT
GOWN STRL REUS W/ TWL LRG LVL3 (GOWN DISPOSABLE) ×1 IMPLANT
GOWN STRL REUS W/TWL LRG LVL3 (GOWN DISPOSABLE) ×2
GOWN STRL REUS W/TWL XL LVL3 (GOWN DISPOSABLE) ×6 IMPLANT
NS IRRIG 1000ML POUR BTL (IV SOLUTION) ×3 IMPLANT
PACK BASIN DAY SURGERY FS (CUSTOM PROCEDURE TRAY) ×3 IMPLANT
SPEAR EYE SURG WECK-CEL (MISCELLANEOUS) ×6 IMPLANT
STRIP CLOSURE SKIN 1/4X4 (GAUZE/BANDAGES/DRESSINGS) IMPLANT
SUT 6 0 SILK T G140 8DA (SUTURE) IMPLANT
SUT MERSILENE 6-0 18IN S14 8MM (SUTURE)
SUT PLAIN 6 0 TG1408 (SUTURE) ×3 IMPLANT
SUT SILK 4 0 C 3 735G (SUTURE) IMPLANT
SUT VICRYL 6 0 S 28 (SUTURE) IMPLANT
SUT VICRYL ABS 6-0 S29 18IN (SUTURE) ×3 IMPLANT
SUTURE MERSLN 6-0 18IN S14 8MM (SUTURE) IMPLANT
SYR 10ML LL (SYRINGE) ×3 IMPLANT
SYR TB 1ML LL NO SAFETY (SYRINGE) ×3 IMPLANT
TOWEL OR 17X24 6PK STRL BLUE (TOWEL DISPOSABLE) ×3 IMPLANT
TRAY DSU PREP LF (CUSTOM PROCEDURE TRAY) ×3 IMPLANT
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
## 2017-04-17 NOTE — Anesthesia Preprocedure Evaluation (Signed)
Anesthesia Evaluation  Patient identified by MRN, date of birth, ID band Patient awake    Reviewed: Allergy & Precautions, NPO status , Patient's Chart, lab work & pertinent test results  History of Anesthesia Complications Negative for: history of anesthetic complications  Airway Mallampati: II  TM Distance: >3 FB Neck ROM: Full    Dental  (+) Teeth Intact   Pulmonary Current Smoker, former smoker,    breath sounds clear to auscultation       Cardiovascular hypertension, negative cardio ROS   Rhythm:Regular     Neuro/Psych PSYCHIATRIC DISORDERS Anxiety Depression negative neurological ROS     GI/Hepatic negative GI ROS, Neg liver ROS,   Endo/Other  negative endocrine ROS  Renal/GU negative Renal ROS     Musculoskeletal   Abdominal   Peds  Hematology negative hematology ROS (+)   Anesthesia Other Findings   Reproductive/Obstetrics (+) Pregnancy                             Anesthesia Physical  Anesthesia Plan  ASA: II  Anesthesia Plan: General   Post-op Pain Management:    Induction: Intravenous  PONV Risk Score and Plan: 2 and Ondansetron and Midazolam  Airway Management Planned: LMA  Additional Equipment:   Intra-op Plan:   Post-operative Plan: Extubation in OR  Informed Consent: I have reviewed the patients History and Physical, chart, labs and discussed the procedure including the risks, benefits and alternatives for the proposed anesthesia with the patient or authorized representative who has indicated his/her understanding and acceptance.   Dental advisory given  Plan Discussed with: Anesthesiologist  Anesthesia Plan Comments:         Anesthesia Quick Evaluation

## 2017-04-17 NOTE — Transfer of Care (Signed)
Immediate Anesthesia Transfer of Care Note  Patient: [name]  Procedure(s) Performed: Procedure(s): REPAIR STRABISMUS LEFT EYE (Left)  Patient Location: PACU  Anesthesia Type:General  Level of Consciousness: awake, alert  and oriented  Airway & Oxygen Therapy: Patient Spontanous Breathing and Patient connected to face mask oxygen  Post-op Assessment: Report given to RN and Post -op Vital signs reviewed and stable  Post vital signs: Reviewed and stable  Last Vitals:  Vitals:   04/17/17 0724  BP: 110/64  Pulse: 76  Resp: 18  Temp: 36.7 C  SpO2: 100%    Last Pain:  Vitals:   04/17/17 0724  TempSrc: Oral  PainSc: 0-No pain         Complications: No apparent anesthesia complications

## 2017-04-17 NOTE — Anesthesia Postprocedure Evaluation (Signed)
Anesthesia Post Note  Patient: [name]  Procedure(s) Performed: Procedure(s) (LRB): REPAIR STRABISMUS LEFT EYE (Left)     Patient location during evaluation: PACU Anesthesia Type: General Level of consciousness: awake and alert Pain management: pain level controlled Vital Signs Assessment: post-procedure vital signs reviewed and stable Respiratory status: spontaneous breathing, nonlabored ventilation and respiratory function stable Cardiovascular status: blood pressure returned to baseline and stable Postop Assessment: no signs of nausea or vomiting Anesthetic complications: no    Last Vitals:  Vitals:   04/17/17 0945 04/17/17 1013  BP: 115/79 128/78  Pulse: 95 96  Resp: 13 16  Temp:  36.8 C  SpO2: 100% 100%    Last Pain:  Vitals:   04/17/17 1013  TempSrc: Oral  PainSc: 0-No pain                 Lowella CurbWarren Ray Miller

## 2017-04-17 NOTE — Op Note (Signed)
04/17/2017  9:22 AM  PATIENT:  [name]  21 y.o. female  PRE-OPERATIVE DIAGNOSIS:  Exotropia, recurrent (consecutive)  POST-OPERATIVE DIAGNOSIS:  same  PROCEDURE:  Lateral rectus muscle re-recession 4.5 mm left eye  SURGEON:  Pasty SpillersWilliam O.Maple HudsonYoung, M.D.   ANESTHESIA:   general  COMPLICATIONS:None  DESCRIPTION OF PROCEDURE: The patient was taken to the operating room where She was identified by me. General anesthesia was induced without difficulty after placement of appropriate monitors. The patient was prepped and draped in standard sterile fashion. A lid speculum was placed in the left eye.  Through an inferotemporal fornix incision through conjunctiva and Tenon's fascia (the same incision used for the previous lateral rectus muscle recession), the left lateral rectus muscle was engaged on a series of muscle hooks from its superior side, taking care to avoid the inferior oblique muscle, and cleared of its fascial attachments. The tendon was secured with a double-armed 6-0 Vicryl suture with a double locking bite at each border of the muscle, 1 mm from the insertion. The muscle was disinserted.  The current insertion was measured to be 14 mm posterior to the limbus.  The muscle was reattached to sclera at a measured distance of 4.5 millimeters posterior to the current insertion, using direct scleral passes in crossed swords fashion.  The suture ends were tied securely after the position of the muscle had been checked and found to be accurate. Conjunctiva was closed with 2 6-0 plain gut sutures.  TobraDex ointment was placed in the left eye. The patient was awakened without difficulty and taken to the recovery room in stable condition, having suffered no intraoperative or immediate postoperative complications.  Pasty SpillersWilliam O. Izabellah Dadisman M.D.    PATIENT DISPOSITION:  PACU - hemodynamically stable.

## 2017-04-17 NOTE — Interval H&P Note (Signed)
History and Physical Interval Note:  04/17/2017 8:14 AM  [name]  has presented today for surgery, with the diagnosis of EXOTROPIA  The various methods of treatment have been discussed with the patient and family. After consideration of risks, benefits and other options for treatment, the patient has consented to  Procedure(s): REPAIR STRABISMUS LEFT EYE (Left) as a surgical intervention .  The patient's history has been reviewed, patient examined, no change in status, stable for surgery.  I have reviewed the patient's chart and labs.  Questions were answered to the patient's satisfaction.     Shara BlazingYOUNG,Cordell Guercio O

## 2017-04-17 NOTE — Discharge Instructions (Signed)
Diet: Clear liquids, advance to soft foods then regular diet as tolerated by this evening.  Pain control:   1)  Ibuprofen 600 mg by mouth every 6-8 hours as needed for pain  2)  Ice pack/cold compress to operated eye(s) as desired  Eye medications:   Tobradex or Zylet eye ointment 1/2 inch in operated eye(s) twice a day if directed to do so by Dr. Maple HudsonYoung  Other medications:  Prednisone 20 mg, two by mouth every morning for 5 days  Activity: No swimming for 1 week.  It is OK to let water run over the face and eyes while showering or taking a bath, even during the first week.  No other restriction on exercise or activity.  If there is a patch on one eye, leave it in place until seen in Dr. Roxy CedarYoung's office this afternoon for suture adjustment.  If there is no patch, you do not need to come to the office this afternoon; just come for your scheduled postop appointment.  Call Dr. Roxy CedarYoung's office [phone] with any problems or concerns.   Post Anesthesia Home Care Instructions  Activity: Get plenty of rest for the remainder of the day. A responsible individual must stay with you for 24 hours following the procedure.  For the next 24 hours, DO NOT: -Drive a car -Advertising copywriterperate machinery -Drink alcoholic beverages -Take any medication unless instructed by your physician -Make any legal decisions or sign important papers.  Meals: Start with liquid foods such as gelatin or soup. Progress to regular foods as tolerated. Avoid greasy, spicy, heavy foods. If nausea and/or vomiting occur, drink only clear liquids until the nausea and/or vomiting subsides. Call your physician if vomiting continues.  Special Instructions/Symptoms: Your throat may feel dry or sore from the anesthesia or the breathing tube placed in your throat during surgery. If this causes discomfort, gargle with warm salt water. The discomfort should disappear within 24 hours.  If you had a scopolamine patch placed behind your ear for the  management of post- operative nausea and/or vomiting:  1. The medication in the patch is effective for 72 hours, after which it should be removed.  Wrap patch in a tissue and discard in the trash. Wash hands thoroughly with soap and water. 2. You may remove the patch earlier than 72 hours if you experience unpleasant side effects which may include dry mouth, dizziness or visual disturbances. 3. Avoid touching the patch. Wash your hands with soap and water after contact with the patch.

## 2017-04-17 NOTE — Anesthesia Procedure Notes (Signed)
Procedure Name: LMA Insertion Date/Time: 04/17/2017 8:29 AM Performed by: Zenia ResidesPAYNE, Koston Hennes D Pre-anesthesia Checklist: Patient identified, Emergency Drugs available, Suction available and Patient being monitored Patient Re-evaluated:Patient Re-evaluated prior to induction Oxygen Delivery Method: Circle system utilized Preoxygenation: Pre-oxygenation with 100% oxygen Induction Type: IV induction Ventilation: Mask ventilation without difficulty LMA: LMA inserted LMA Size: 4.0 Number of attempts: 1 Airway Equipment and Method: Bite block Placement Confirmation: positive ETCO2 Tube secured with: Tape Dental Injury: Teeth and Oropharynx as per pre-operative assessment

## 2017-04-17 NOTE — Anesthesia Procedure Notes (Signed)
Performed by: Ayden Hardwick D       

## 2017-04-20 ENCOUNTER — Encounter (HOSPITAL_BASED_OUTPATIENT_CLINIC_OR_DEPARTMENT_OTHER): Payer: Self-pay | Admitting: Ophthalmology

## 2017-05-14 ENCOUNTER — Encounter (HOSPITAL_COMMUNITY): Payer: Self-pay

## 2017-05-14 ENCOUNTER — Emergency Department (HOSPITAL_COMMUNITY)
Admission: EM | Admit: 2017-05-14 | Discharge: 2017-05-14 | Disposition: A | Discharge status: Home / Self Care | Payer: Medicaid Other | Attending: Emergency Medicine | Admitting: Emergency Medicine

## 2017-05-14 ENCOUNTER — Emergency Department (HOSPITAL_COMMUNITY): Payer: Medicaid Other

## 2017-05-14 DIAGNOSIS — F329 Major depressive disorder, single episode, unspecified: Secondary | ICD-10-CM | POA: Diagnosis not present

## 2017-05-14 DIAGNOSIS — Y92009 Unspecified place in unspecified non-institutional (private) residence as the place of occurrence of the external cause: Secondary | ICD-10-CM | POA: Diagnosis not present

## 2017-05-14 DIAGNOSIS — I10 Essential (primary) hypertension: Secondary | ICD-10-CM | POA: Diagnosis not present

## 2017-05-14 DIAGNOSIS — Y9389 Activity, other specified: Secondary | ICD-10-CM | POA: Insufficient documentation

## 2017-05-14 DIAGNOSIS — Z79899 Other long term (current) drug therapy: Secondary | ICD-10-CM | POA: Diagnosis not present

## 2017-05-14 DIAGNOSIS — S022XXA Fracture of nasal bones, initial encounter for closed fracture: Secondary | ICD-10-CM | POA: Insufficient documentation

## 2017-05-14 DIAGNOSIS — Y998 Other external cause status: Secondary | ICD-10-CM | POA: Insufficient documentation

## 2017-05-14 DIAGNOSIS — F17210 Nicotine dependence, cigarettes, uncomplicated: Secondary | ICD-10-CM | POA: Insufficient documentation

## 2017-05-14 DIAGNOSIS — F419 Anxiety disorder, unspecified: Secondary | ICD-10-CM | POA: Insufficient documentation

## 2017-05-14 DIAGNOSIS — S0990XA Unspecified injury of head, initial encounter: Secondary | ICD-10-CM | POA: Diagnosis present

## 2017-05-14 MED ORDER — IBUPROFEN 800 MG PO TABS: 800.0000 mg | ORAL_TABLET | Freq: Three times a day (TID) | ORAL | 0 refills | Status: DC

## 2017-05-14 MED ORDER — OXYCODONE-ACETAMINOPHEN 5-325 MG PO TABS
1.0000 | ORAL_TABLET | Freq: Once | ORAL | Status: AC
  Administered 2017-05-14: 1 via ORAL
  Filled 2017-05-14: qty 1
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
## 2017-05-14 NOTE — ED Notes (Signed)
Bed: WA06 Expected date:  Expected time:  Means of arrival:  Comments: No bed  
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
## 2019-12-10 ENCOUNTER — Other Ambulatory Visit: Payer: Self-pay

## 2019-12-10 DIAGNOSIS — X789XXA Intentional self-harm by unspecified sharp object, initial encounter: Secondary | ICD-10-CM | POA: Insufficient documentation

## 2019-12-10 DIAGNOSIS — R45851 Suicidal ideations: Secondary | ICD-10-CM | POA: Insufficient documentation

## 2019-12-10 DIAGNOSIS — Y929 Unspecified place or not applicable: Secondary | ICD-10-CM | POA: Insufficient documentation

## 2019-12-10 DIAGNOSIS — Y9389 Activity, other specified: Secondary | ICD-10-CM | POA: Diagnosis not present

## 2019-12-10 DIAGNOSIS — S61512A Laceration without foreign body of left wrist, initial encounter: Secondary | ICD-10-CM | POA: Diagnosis not present

## 2019-12-10 DIAGNOSIS — Y999 Unspecified external cause status: Secondary | ICD-10-CM | POA: Diagnosis not present

## 2019-12-10 DIAGNOSIS — F1014 Alcohol abuse with alcohol-induced mood disorder: Secondary | ICD-10-CM | POA: Insufficient documentation

## 2019-12-10 DIAGNOSIS — Y906 Blood alcohol level of 120-199 mg/100 ml: Secondary | ICD-10-CM | POA: Diagnosis not present

## 2019-12-10 DIAGNOSIS — Z20822 Contact with and (suspected) exposure to covid-19: Secondary | ICD-10-CM | POA: Insufficient documentation

## 2019-12-10 DIAGNOSIS — F1094 Alcohol use, unspecified with alcohol-induced mood disorder: Secondary | ICD-10-CM | POA: Diagnosis not present

## 2019-12-11 ENCOUNTER — Emergency Department (HOSPITAL_COMMUNITY)
Admission: EM | Admit: 2019-12-11 | Discharge: 2019-12-11 | Disposition: A | Discharge status: Home / Self Care | Payer: 59 | Attending: Emergency Medicine | Admitting: Emergency Medicine

## 2019-12-11 ENCOUNTER — Encounter (HOSPITAL_COMMUNITY): Payer: Self-pay

## 2019-12-11 DIAGNOSIS — S61512A Laceration without foreign body of left wrist, initial encounter: Secondary | ICD-10-CM

## 2019-12-11 DIAGNOSIS — F1094 Alcohol use, unspecified with alcohol-induced mood disorder: Secondary | ICD-10-CM | POA: Diagnosis present

## 2019-12-11 DIAGNOSIS — Z7289 Other problems related to lifestyle: Secondary | ICD-10-CM

## 2019-12-11 LAB — SALICYLATE LEVEL: Salicylate Lvl: <7 mg/dL — ABNORMAL LOW (ref 7.0–30.0)

## 2019-12-11 LAB — CBC WITH DIFFERENTIAL/PLATELET
Abs Immature Granulocytes: 0.04 10*3/uL (ref 0.00–0.07)
Basophils Absolute: 0 10*3/uL (ref 0.0–0.1)
Basophils Relative: 0 %
Eosinophils Absolute: 0 10*3/uL (ref 0.0–0.5)
Eosinophils Relative: 0 %
HCT: 38.6 % (ref 36.0–46.0)
Hemoglobin: 12.9 g/dL (ref 12.0–15.0)
Immature Granulocytes: 0 %
Lymphocytes Relative: 22 %
Lymphs Abs: 2.3 10*3/uL (ref 0.7–4.0)
MCH: 30.4 pg (ref 26.0–34.0)
MCHC: 33.4 g/dL (ref 30.0–36.0)
MCV: 90.8 fL (ref 80.0–100.0)
Monocytes Absolute: 0.4 10*3/uL (ref 0.1–1.0)
Monocytes Relative: 4 %
Neutro Abs: 7.5 10*3/uL (ref 1.7–7.7)
Neutrophils Relative %: 74 %
Platelets: 382 10*3/uL (ref 150–400)
RBC: 4.25 MIL/uL (ref 3.87–5.11)
RDW: 12.8 % (ref 11.5–15.5)
WBC: 10.3 10*3/uL (ref 4.0–10.5)
nRBC: 0 % (ref 0.0–0.2)

## 2019-12-11 LAB — RESPIRATORY PANEL BY RT PCR (FLU A&B, COVID)
Influenza A by PCR: NEGATIVE
Influenza B by PCR: NEGATIVE
SARS Coronavirus 2 by RT PCR: NEGATIVE

## 2019-12-11 LAB — RAPID URINE DRUG SCREEN, HOSP PERFORMED
Amphetamines: NOT DETECTED
Barbiturates: NOT DETECTED
Benzodiazepines: POSITIVE — AB
Cocaine: NOT DETECTED
Opiates: NOT DETECTED
Tetrahydrocannabinol: POSITIVE — AB

## 2019-12-11 LAB — COMPREHENSIVE METABOLIC PANEL
ALT: 18 U/L (ref 0–44)
AST: 21 U/L (ref 15–41)
Albumin: 4.9 g/dL (ref 3.5–5.0)
Alkaline Phosphatase: 58 U/L (ref 38–126)
Anion gap: 13 (ref 5–15)
BUN: 11 mg/dL (ref 6–20)
CO2: 20 mmol/L — ABNORMAL LOW (ref 22–32)
Calcium: 9.1 mg/dL (ref 8.9–10.3)
Chloride: 108 mmol/L (ref 98–111)
Creatinine, Ser: 0.62 mg/dL (ref 0.44–1.00)
GFR calc Af Amer: >60 mL/min (ref >60)
GFR calc non Af Amer: >60 mL/min (ref >60)
Glucose, Bld: 102 mg/dL — ABNORMAL HIGH (ref 70–99)
Potassium: 4 mmol/L (ref 3.5–5.1)
Sodium: 141 mmol/L (ref 135–145)
Total Bilirubin: 0.8 mg/dL (ref 0.3–1.2)
Total Protein: 8.6 g/dL — ABNORMAL HIGH (ref 6.5–8.1)

## 2019-12-11 LAB — ETHANOL: Alcohol, Ethyl (B): 174 mg/dL — ABNORMAL HIGH (ref <10)

## 2019-12-11 LAB — ACETAMINOPHEN LEVEL: Acetaminophen (Tylenol), Serum: <10 ug/mL — ABNORMAL LOW (ref 10–30)

## 2019-12-11 LAB — I-STAT BETA HCG BLOOD, ED (MC, WL, AP ONLY): I-stat hCG, quantitative: <5 m[IU]/mL (ref <5)

## 2019-12-11 MED ORDER — ONDANSETRON HCL 4 MG PO TABS: 4.0000 mg | ORAL_TABLET | Freq: Three times a day (TID) | ORAL | Status: DC | PRN

## 2019-12-11 MED ORDER — NICOTINE 21 MG/24HR TD PT24: 21.0000 mg | MEDICATED_PATCH | Freq: Once | TRANSDERMAL | Status: DC

## 2019-12-11 MED ORDER — THIAMINE HCL 100 MG/ML IJ SOLN: 100.0000 mg | Freq: Every day | INTRAMUSCULAR | Status: DC

## 2019-12-11 MED ORDER — LORAZEPAM 1 MG PO TABS: 0.0000 mg | ORAL_TABLET | Freq: Two times a day (BID) | ORAL | Status: DC

## 2019-12-11 MED ORDER — LIDOCAINE-EPINEPHRINE (PF) 2 %-1:200000 IJ SOLN
10.0000 mL | Freq: Once | INTRAMUSCULAR | Status: AC
  Administered 2019-12-11: 10 mL
  Filled 2019-12-11: qty 20

## 2019-12-11 MED ORDER — LORAZEPAM 2 MG/ML IJ SOLN: 0.0000 mg | Freq: Two times a day (BID) | INTRAMUSCULAR | Status: DC

## 2019-12-11 MED ORDER — TETANUS-DIPHTH-ACELL PERTUSSIS 5-2.5-18.5 LF-MCG/0.5 IM SUSP: 0.5000 mL | Freq: Once | INTRAMUSCULAR | Status: DC

## 2019-12-11 MED ORDER — IBUPROFEN 600 MG PO TABS: 600.0000 mg | ORAL_TABLET | Freq: Three times a day (TID) | ORAL | 0 refills | Status: AC | PRN

## 2019-12-11 MED ORDER — THIAMINE HCL 100 MG PO TABS: 100.0000 mg | ORAL_TABLET | Freq: Every day | ORAL | Status: DC

## 2019-12-11 MED ORDER — IBUPROFEN 200 MG PO TABS: 600.0000 mg | ORAL_TABLET | Freq: Three times a day (TID) | ORAL | Status: DC | PRN

## 2019-12-11 MED ORDER — LORAZEPAM 1 MG PO TABS: 0.0000 mg | ORAL_TABLET | Freq: Four times a day (QID) | ORAL | Status: DC

## 2019-12-11 MED ORDER — LORAZEPAM 2 MG/ML IJ SOLN: 0.0000 mg | Freq: Four times a day (QID) | INTRAMUSCULAR | Status: DC

## 2019-12-11 MED ORDER — TETANUS-DIPHTH-ACELL PERTUSSIS 5-2.5-18.5 LF-MCG/0.5 IM SUSP: 0.5000 mL | Freq: Once | INTRAMUSCULAR | 0 refills | Status: AC

## 2019-12-11 NOTE — ED Notes (Signed)
Pt dressed in purple scrubs and yellow non slip socks. Pt belongings placed in belongings bags. Pt advised she can have her cell phone long enough to secure child care for her son.

## 2019-12-11 NOTE — ED Notes (Signed)
Pt refusing COVID swab.

## 2019-12-11 NOTE — Discharge Instructions (Signed)
Your sutures need to be removed in 7 to 10 days.  You can have these removed by returning to the ER, going to urgent care, or following up with primary care.    Self-Destructive Behavior Self-destructive behavior, or dysregulated behavior, refers to behaviors that can harm or injure the person who performs them. Self-destructive behavior is often used as a way to cope and deal with painful emotions and stress. It is often habit-forming and difficult to control without help. Certain self-destructive behaviors can result in serious injury or death. What are signs of self-destructive behavior? Signs of self-destructive behavior include:  Hurting yourself (self-injury) to release tension or lessen emotional pain. This includes banging your head and cutting, scratching, or burning yourself.  Eating very little or not eating at all (anorexia), eating and making yourself vomit (purging), overeating in a short time period (binge eating), and using medicine such as laxatives and water pills to lose weight.  Drinking too much alcohol.  Abusing drugs.  Going on shopping sprees, spending recklessly, or gambling until you go into debt.  Any type of addictive behavior, including gambling and shoplifting.  Not setting healthy limits. This includes denying yourself proper rest and food in order to meet the needs of others. People often feel relief or pleasure while doing these things. However, they can be harmful if they are done over a long period of time. What causes self-destructive behavior? The underlying causes of self-destructive behavior may include:  Current or past trauma or abuse.  Mental health conditions, such as depression or anxiety.  Difficulty managing stress.  Difficulty coping with emotions. This may be due to inability to identify or talk about one's feelings or problems. Self-destructive behavior may also occur with some medical conditions such as:  Epilepsy.  Long-term  (chronic) pain.  Sleep disorders.  Traumatic brain injury. What should I do to manage self-destructive behavior?   Talk with your health care provider. He or she may refer you to a mental health counselor for talk therapy.  Work with a Veterinary surgeon to help you recognize the source of your problems, sort out your feelings, and find strategies to cope with stress and your feelings.  Avoid alcohol and drugs.  Try to distract yourself with other activities until you are calm and are able to seek help. These include chewing gum, exercising, cleaning, or snapping rubber bands.  Find healthy coping strategies that work for you, such as: ? Exercise. ? Spending time in nature. ? Journaling. ? Relaxation methods, such as deep breathing or yoga.  Learn to identify and avoid the things that trigger your self-destructive behavior.  Get involved in a local support group. Contact a health care provider if:  You become ill or you get injured as a result of self-destructive behavior.  You are ready to seek treatment for addiction.  You need help finding a support group. Get help right away if:  Your behavior interferes with daily activities, such as work or school.  You have thoughts of hurting yourself or others. If you ever feel like you may hurt yourself or others, or have thoughts about taking your own life, get help right away. You can go to your nearest emergency department or call:  Your local emergency services (911 in the [place]  A suicide crisis helpline, such as the National Suicide Prevention Lifeline at (930)607-9410. This is open 24 hours a day. Summary  Self-destructive behavior includes behaviors, such as cutting, eating disorders, drug or alcohol abuse, and gambling.  These may feel good in the moment, but can be harmful over time.  Self-destructive behavior is often used as a way to deal with painful emotions and stress. It is often habit-forming and difficult to control  without help.  See your health care provider or counselor if you have thoughts of, or have been injured by, self-destructive behavior. He or she will help you recognize the source of your problems, sort out your feelings, and get the help you need. This information is not intended to replace advice given to you by your health care provider. Make sure you discuss any questions you have with your health care provider. Document Revised: 07/10/2017 Document Reviewed: 10/23/2016 Elsevier Patient Education  [place].

## 2019-12-11 NOTE — ED Notes (Addendum)
Pt continuously pacing around room and cussing. Pt keeps stating " I have a fucking three year old. Who is gonna watch my kid?" "Is there some law that says I have to be here?" Pt continues coming out of the room and asking if there is a law stating she has to be here.
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
## 2019-12-11 NOTE — ED Notes (Signed)
Pt left arm re-wrapped after pt made staff aware that the bleeding from her laceration was no longer controlled. Pressure applied to pt's left forearm with gauze while pt held arm up. Pt's laceration wrapped with roller gauze and secured. Bleeding remains uncontrolled.
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
## 2019-12-11 NOTE — ED Notes (Signed)
Patient denies SI/HI. Apolinar Junes, patient's point of contact, no concerns at this time. Reports that he feels safe with patient's discharge.

## 2019-12-11 NOTE — ED Triage Notes (Signed)
Pt cut her wrist in a suicide attempt tonight. She also admits to ETOH and taking 4 percocets. Also has broken arm from a previous injury.

## 2020-06-29 ENCOUNTER — Emergency Department (HOSPITAL_COMMUNITY)
Admission: EM | Admit: 2020-06-29 | Discharge: 2020-06-30 | Disposition: A | Discharge status: Home / Self Care | Payer: Medicaid Other | Attending: Emergency Medicine | Admitting: Emergency Medicine

## 2020-06-29 ENCOUNTER — Other Ambulatory Visit: Payer: Self-pay

## 2020-06-29 ENCOUNTER — Emergency Department (HOSPITAL_COMMUNITY): Payer: Medicaid Other

## 2020-06-29 DIAGNOSIS — S8264XA Nondisplaced fracture of lateral malleolus of right fibula, initial encounter for closed fracture: Secondary | ICD-10-CM | POA: Diagnosis not present

## 2020-06-29 DIAGNOSIS — I10 Essential (primary) hypertension: Secondary | ICD-10-CM | POA: Insufficient documentation

## 2020-06-29 DIAGNOSIS — S82891A Other fracture of right lower leg, initial encounter for closed fracture: Secondary | ICD-10-CM

## 2020-06-29 DIAGNOSIS — R52 Pain, unspecified: Secondary | ICD-10-CM

## 2020-06-29 DIAGNOSIS — F17210 Nicotine dependence, cigarettes, uncomplicated: Secondary | ICD-10-CM | POA: Insufficient documentation

## 2020-06-29 DIAGNOSIS — S8291XA Unspecified fracture of right lower leg, initial encounter for closed fracture: Secondary | ICD-10-CM | POA: Insufficient documentation

## 2020-06-29 LAB — I-STAT CHEM 8, ED
BUN: 9 mg/dL (ref 6–20)
Calcium, Ion: 1.14 mmol/L — ABNORMAL LOW (ref 1.15–1.40)
Chloride: 105 mmol/L (ref 98–111)
Creatinine, Ser: 1.2 mg/dL — ABNORMAL HIGH (ref 0.44–1.00)
Glucose, Bld: 111 mg/dL — ABNORMAL HIGH (ref 70–99)
HCT: 40 % (ref 36.0–46.0)
Hemoglobin: 13.6 g/dL (ref 12.0–15.0)
Potassium: 3 mmol/L — ABNORMAL LOW (ref 3.5–5.1)
Sodium: 143 mmol/L (ref 135–145)
TCO2: 21 mmol/L — ABNORMAL LOW (ref 22–32)
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
## 2020-06-29 NOTE — ED Notes (Signed)
Patient verbally aggressive with staff and [employer].Neck brace taken off by patient.
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
## 2020-06-29 NOTE — ED Notes (Signed)
Patient transported to X-ray 

## 2020-06-30 LAB — COMPREHENSIVE METABOLIC PANEL
ALT: 24 U/L (ref 0–44)
AST: 38 U/L (ref 15–41)
Albumin: 4.3 g/dL (ref 3.5–5.0)
Alkaline Phosphatase: 66 U/L (ref 38–126)
Anion gap: 13 (ref 5–15)
BUN: 9 mg/dL (ref 6–20)
CO2: 21 mmol/L — ABNORMAL LOW (ref 22–32)
Calcium: 9 mg/dL (ref 8.9–10.3)
Chloride: 107 mmol/L (ref 98–111)
Creatinine, Ser: 0.82 mg/dL (ref 0.44–1.00)
GFR, Estimated: >60 mL/min (ref >60)
Glucose, Bld: 118 mg/dL — ABNORMAL HIGH (ref 70–99)
Potassium: 3 mmol/L — ABNORMAL LOW (ref 3.5–5.1)
Sodium: 141 mmol/L (ref 135–145)
Total Bilirubin: 0.5 mg/dL (ref 0.3–1.2)
Total Protein: 7.7 g/dL (ref 6.5–8.1)

## 2020-06-30 LAB — CBC
HCT: 39.6 % (ref 36.0–46.0)
Hemoglobin: 12.8 g/dL (ref 12.0–15.0)
MCH: 28.8 pg (ref 26.0–34.0)
MCHC: 32.3 g/dL (ref 30.0–36.0)
MCV: 89 fL (ref 80.0–100.0)
Platelets: 327 10*3/uL (ref 150–400)
RBC: 4.45 MIL/uL (ref 3.87–5.11)
RDW: 14.5 % (ref 11.5–15.5)
WBC: 10.1 10*3/uL (ref 4.0–10.5)
nRBC: 0 % (ref 0.0–0.2)

## 2020-06-30 LAB — URINALYSIS, ROUTINE W REFLEX MICROSCOPIC
Bilirubin Urine: NEGATIVE
Glucose, UA: NEGATIVE mg/dL
Ketones, ur: NEGATIVE mg/dL
Leukocytes,Ua: NEGATIVE
Nitrite: NEGATIVE
Protein, ur: NEGATIVE mg/dL
Specific Gravity, Urine: 1.002 — ABNORMAL LOW (ref 1.005–1.030)
pH: 7 (ref 5.0–8.0)

## 2020-06-30 LAB — PROTIME-INR
INR: 1.1 (ref 0.8–1.2)
Prothrombin Time: 13.9 seconds (ref 11.4–15.2)

## 2020-06-30 LAB — ETHANOL: Alcohol, Ethyl (B): 313 mg/dL (ref <10)

## 2020-06-30 NOTE — ED Notes (Signed)
EMS IV removed by this NT

## 2020-06-30 NOTE — Progress Notes (Signed)
Orthopedic Tech Progress Note Patient Details:  [name] [birth year] [id number]  Ortho Devices Type of Ortho Device: Crutches, Post (short leg) splint, Stirrup splint Ortho Device/Splint Location: rle Ortho Device/Splint Interventions: Ordered, Application, Adjustment   Post Interventions Patient Tolerated: Well Instructions Provided: Care of device, Adjustment of device   Trinna Post 06/30/2020, 1:11 AM

## 2020-06-30 NOTE — Discharge Instructions (Addendum)
Please avoid drinking and driving in the future.   Please call the orthopedic office tomorrow or Monday to set up an appointment for definitive treatment of your ankle fracture.

## 2020-06-30 NOTE — ED Notes (Signed)
Family for patient on the way

## 2021-05-28 ENCOUNTER — Emergency Department (HOSPITAL_COMMUNITY): Payer: 59

## 2021-05-28 ENCOUNTER — Encounter (HOSPITAL_COMMUNITY): Payer: Self-pay

## 2021-05-28 ENCOUNTER — Emergency Department (HOSPITAL_COMMUNITY)
Admission: EM | Admit: 2021-05-28 | Discharge: 2021-05-28 | Disposition: A | Discharge status: Home / Self Care | Payer: 59 | Attending: Emergency Medicine | Admitting: Emergency Medicine

## 2021-05-28 ENCOUNTER — Other Ambulatory Visit: Payer: Self-pay

## 2021-05-28 DIAGNOSIS — R221 Localized swelling, mass and lump, neck: Secondary | ICD-10-CM | POA: Diagnosis not present

## 2021-05-28 DIAGNOSIS — R079 Chest pain, unspecified: Secondary | ICD-10-CM | POA: Diagnosis not present

## 2021-05-28 DIAGNOSIS — Z5321 Procedure and treatment not carried out due to patient leaving prior to being seen by health care provider: Secondary | ICD-10-CM | POA: Diagnosis not present

## 2021-05-28 DIAGNOSIS — R519 Headache, unspecified: Secondary | ICD-10-CM | POA: Insufficient documentation

## 2021-05-28 DIAGNOSIS — J689 Unspecified respiratory condition due to chemicals, gases, fumes and vapors: Secondary | ICD-10-CM | POA: Diagnosis not present

## 2021-05-28 DIAGNOSIS — T5991XA Toxic effect of unspecified gases, fumes and vapors, accidental (unintentional), initial encounter: Secondary | ICD-10-CM | POA: Diagnosis not present

## 2021-05-28 LAB — CBC WITH DIFFERENTIAL/PLATELET
Abs Immature Granulocytes: 0.04 10*3/uL (ref 0.00–0.07)
Basophils Absolute: 0 10*3/uL (ref 0.0–0.1)
Basophils Relative: 0 %
Eosinophils Absolute: 0.1 10*3/uL (ref 0.0–0.5)
Eosinophils Relative: 0 %
HCT: 38.3 % (ref 36.0–46.0)
Hemoglobin: 13.3 g/dL (ref 12.0–15.0)
Immature Granulocytes: 0 %
Lymphocytes Relative: 15 %
Lymphs Abs: 2.2 10*3/uL (ref 0.7–4.0)
MCH: 32 pg (ref 26.0–34.0)
MCHC: 34.7 g/dL (ref 30.0–36.0)
MCV: 92.1 fL (ref 80.0–100.0)
Monocytes Absolute: 1 10*3/uL (ref 0.1–1.0)
Monocytes Relative: 7 %
Neutro Abs: 11.2 10*3/uL — ABNORMAL HIGH (ref 1.7–7.7)
Neutrophils Relative %: 78 %
Platelets: 226 10*3/uL (ref 150–400)
RBC: 4.16 MIL/uL (ref 3.87–5.11)
RDW: 12 % (ref 11.5–15.5)
WBC: 14.6 10*3/uL — ABNORMAL HIGH (ref 4.0–10.5)
nRBC: 0 % (ref 0.0–0.2)

## 2021-05-28 LAB — BASIC METABOLIC PANEL
Anion gap: 9 (ref 5–15)
BUN: 8 mg/dL (ref 6–20)
CO2: 23 mmol/L (ref 22–32)
Calcium: 9.5 mg/dL (ref 8.9–10.3)
Chloride: 104 mmol/L (ref 98–111)
Creatinine, Ser: 0.85 mg/dL (ref 0.44–1.00)
GFR, Estimated: >60 mL/min (ref >60)
Glucose, Bld: 102 mg/dL — ABNORMAL HIGH (ref 70–99)
Potassium: 4 mmol/L (ref 3.5–5.1)
Sodium: 136 mmol/L (ref 135–145)
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
## 2021-07-12 ENCOUNTER — Ambulatory Visit: Payer: 59 | Admitting: Orthopaedic Surgery

## 2022-07-07 ENCOUNTER — Emergency Department (HOSPITAL_COMMUNITY): Payer: 59

## 2022-07-07 ENCOUNTER — Emergency Department (HOSPITAL_COMMUNITY)
Admission: EM | Admit: 2022-07-07 | Discharge: 2022-07-08 | Disposition: A | Discharge status: Home / Self Care | Payer: 59 | Attending: Emergency Medicine | Admitting: Emergency Medicine

## 2022-07-07 DIAGNOSIS — R1032 Left lower quadrant pain: Secondary | ICD-10-CM | POA: Insufficient documentation

## 2022-07-07 DIAGNOSIS — R1031 Right lower quadrant pain: Secondary | ICD-10-CM | POA: Insufficient documentation

## 2022-07-07 DIAGNOSIS — R110 Nausea: Secondary | ICD-10-CM | POA: Diagnosis not present

## 2022-07-07 DIAGNOSIS — I10 Essential (primary) hypertension: Secondary | ICD-10-CM | POA: Diagnosis not present

## 2022-07-07 DIAGNOSIS — Z79899 Other long term (current) drug therapy: Secondary | ICD-10-CM | POA: Insufficient documentation

## 2022-07-07 DIAGNOSIS — E876 Hypokalemia: Secondary | ICD-10-CM | POA: Diagnosis not present

## 2022-07-07 DIAGNOSIS — R102 Pelvic and perineal pain: Secondary | ICD-10-CM

## 2022-07-07 DIAGNOSIS — N83202 Unspecified ovarian cyst, left side: Secondary | ICD-10-CM

## 2022-07-07 DIAGNOSIS — B9689 Other specified bacterial agents as the cause of diseases classified elsewhere: Secondary | ICD-10-CM

## 2022-07-07 LAB — COMPREHENSIVE METABOLIC PANEL
ALT: 18 U/L (ref 0–44)
AST: 23 U/L (ref 15–41)
Albumin: 3.9 g/dL (ref 3.5–5.0)
Alkaline Phosphatase: 46 U/L (ref 38–126)
Anion gap: 11 (ref 5–15)
BUN: 20 mg/dL (ref 6–20)
CO2: 20 mmol/L — ABNORMAL LOW (ref 22–32)
Calcium: 9.1 mg/dL (ref 8.9–10.3)
Chloride: 106 mmol/L (ref 98–111)
Creatinine, Ser: 0.99 mg/dL (ref 0.44–1.00)
GFR, Estimated: >60 mL/min (ref >60)
Glucose, Bld: 115 mg/dL — ABNORMAL HIGH (ref 70–99)
Potassium: 3.4 mmol/L — ABNORMAL LOW (ref 3.5–5.1)
Sodium: 137 mmol/L (ref 135–145)
Total Bilirubin: 0.5 mg/dL (ref 0.3–1.2)
Total Protein: 6.8 g/dL (ref 6.5–8.1)

## 2022-07-07 LAB — CBC WITH DIFFERENTIAL/PLATELET
Abs Immature Granulocytes: 0.01 10*3/uL (ref 0.00–0.07)
Basophils Absolute: 0 10*3/uL (ref 0.0–0.1)
Basophils Relative: 1 %
Eosinophils Absolute: 0.1 10*3/uL (ref 0.0–0.5)
Eosinophils Relative: 2 %
HCT: 35.3 % — ABNORMAL LOW (ref 36.0–46.0)
Hemoglobin: 12.3 g/dL (ref 12.0–15.0)
Immature Granulocytes: 0 %
Lymphocytes Relative: 39 %
Lymphs Abs: 3 10*3/uL (ref 0.7–4.0)
MCH: 31.9 pg (ref 26.0–34.0)
MCHC: 34.8 g/dL (ref 30.0–36.0)
MCV: 91.5 fL (ref 80.0–100.0)
Monocytes Absolute: 0.6 10*3/uL (ref 0.1–1.0)
Monocytes Relative: 8 %
Neutro Abs: 3.8 10*3/uL (ref 1.7–7.7)
Neutrophils Relative %: 50 %
Platelets: 267 10*3/uL (ref 150–400)
RBC: 3.86 MIL/uL — ABNORMAL LOW (ref 3.87–5.11)
RDW: 12.7 % (ref 11.5–15.5)
WBC: 7.6 10*3/uL (ref 4.0–10.5)
nRBC: 0 % (ref 0.0–0.2)

## 2022-07-07 LAB — URINALYSIS, ROUTINE W REFLEX MICROSCOPIC
Bilirubin Urine: NEGATIVE
Glucose, UA: NEGATIVE mg/dL
Hgb urine dipstick: NEGATIVE
Ketones, ur: NEGATIVE mg/dL
Leukocytes,Ua: NEGATIVE
Nitrite: NEGATIVE
Protein, ur: NEGATIVE mg/dL
Specific Gravity, Urine: 1.025 (ref 1.005–1.030)
pH: 6 (ref 5.0–8.0)

## 2022-07-07 LAB — LIPASE, BLOOD: Lipase: 35 U/L (ref 11–51)

## 2022-07-07 LAB — I-STAT BETA HCG BLOOD, ED (MC, WL, AP ONLY): I-stat hCG, quantitative: <5 m[IU]/mL (ref <5)

## 2022-07-07 MED ORDER — IOHEXOL 350 MG/ML SOLN
75.0000 mL | Freq: Once | INTRAVENOUS | Status: AC | PRN
  Administered 2022-07-07: 75 mL via INTRAVENOUS

## 2022-07-07 NOTE — ED Provider Triage Note (Signed)
Emergency Medicine Provider Triage Evaluation Note  [name] , a 26 y.o. female  was evaluated in triage.  Pt complains of abd pain, generalized. Onset today. + n/v. No urinary sxs. No hx of same  Review of Systems  Positive: Abd pain Negative: Fever  Physical Exam  BP 123/76   Pulse 72   Temp 98.7 F (37.1 C) (Oral)   Resp 16   Ht 5\' 7"  (1.702 m)   Wt 81.6 kg   SpO2 97%   BMI 28.19 kg/m  Gen:   Awake, no distress   Resp:  Normal effort  MSK:   Moves extremities without difficulty  Other:  TTP   Medical Decision Making  Medically screening exam initiated at 8:27 PM.  Appropriate orders placed.  [name] was informed that the remainder of the evaluation will be completed by another provider, this initial triage assessment does not replace that evaluation, and the importance of remaining in the ED until their evaluation is complete.     Elisabeth Most, PA-C 07/07/22 2034

## 2022-07-07 NOTE — ED Triage Notes (Signed)
Pt to ED c/o abdominal pain that started this morning. Denies N/V, No hx of abdominal surgery. Reports pain location lower middle.

## 2022-07-08 ENCOUNTER — Emergency Department (HOSPITAL_COMMUNITY): Payer: 59

## 2022-07-08 LAB — WET PREP, GENITAL
Sperm: NONE SEEN
Trich, Wet Prep: NONE SEEN
WBC, Wet Prep HPF POC: >=10 — AB (ref <10)
Yeast Wet Prep HPF POC: NONE SEEN

## 2022-07-08 MED ORDER — HYDROCODONE-ACETAMINOPHEN 5-325 MG PO TABS: 1.0000 | ORAL_TABLET | Freq: Four times a day (QID) | ORAL | 0 refills | Status: AC | PRN

## 2022-07-08 MED ORDER — HYDROCODONE-ACETAMINOPHEN 5-325 MG PO TABS
1.0000 | ORAL_TABLET | Freq: Once | ORAL | Status: AC
  Administered 2022-07-08: 1 via ORAL
  Filled 2022-07-08: qty 1

## 2022-07-08 MED ORDER — HYDROCODONE-ACETAMINOPHEN 7.5-325 MG/15ML PO SOLN
10.0000 mL | Freq: Once | ORAL | Status: DC
  Filled 2022-07-08: qty 15

## 2022-07-08 MED ORDER — POTASSIUM CHLORIDE CRYS ER 20 MEQ PO TBCR
40.0000 meq | EXTENDED_RELEASE_TABLET | Freq: Once | ORAL | Status: AC
  Administered 2022-07-08: 40 meq via ORAL
  Filled 2022-07-08: qty 2

## 2022-07-08 MED ORDER — METRONIDAZOLE 500 MG PO TABS: 500.0000 mg | ORAL_TABLET | Freq: Two times a day (BID) | ORAL | 0 refills | Status: AC

## 2022-07-08 NOTE — ED Provider Notes (Signed)
[facility] EMERGENCY DEPARTMENT Provider Note   CSN: [id number] Arrival date & time: 07/07/22  1752     History  Chief Complaint  Patient presents with   Abdominal Pain    [name] is a 26 y.o. female.  With a history of PTSD, oppositional defiant disorder, depression, anxiety, arthritis, hypertension, history of substance abuse who presents to the ED for evaluation of abdominal pain.  She states she woke up yesterday at 5 AM with sudden onset sharp lower abdominal pain.  Rated at a 10 out of 10 at that time.  States improved slightly throughout the day, however when she buttons her work pants it  makes the pain worse.  She states she is active at work which is also makes the pain worse.  Localizes it to the lower abdomen without radiation.  States she feels pressure like when she has had UTIs in the past, but denies dysuria, frequency or urgency.  Reports some nausea associated with the pain, but is able to tolerate p.o. intake.  No history of abdominal surgeries.  Vapes daily.  Drinks alcohol 3 days a week on average with her last intake 3 days ago.  Denies fevers or chills, diarrhea, chest pain, shortness of breath. rates the pain at a 5 out of 10 at this time.   Abdominal Pain Associated symptoms: nausea        Home Medications Prior to Admission medications   Medication Sig Start Date End Date Taking? Authorizing Provider  etonogestrel (NEXPLANON) 68 MG IMPL implant 1 each by Subdermal route once.    [provider]  gabapentin (NEURONTIN) 100 MG capsule Take 200 mg by mouth 3 (three) times daily.    [provider]  ibuprofen (ADVIL) 200 MG tablet Take 600 mg by mouth every 6 (six) hours as needed for moderate pain.    [provider]  ibuprofen (ADVIL) 600 MG tablet Take 1 tablet (600 mg total) by mouth every 8 (eight) hours as needed for mild pain (temp > 38.3 Celsius). 12/11/19   Charm Rings, NP  vitamin B-12  (CYANOCOBALAMIN) 1000 MCG tablet Take 1,000 mcg by mouth every Saturday.    [provider]      Allergies    Patient has no known allergies.    Review of Systems   Review of Systems  Gastrointestinal:  Positive for abdominal pain and nausea.  All other systems reviewed and are negative.   Physical Exam Updated Vital Signs BP 135/72 (BP Location: Right Arm)   Pulse 82   Temp 98 F (36.7 C) (Oral)   Resp 16   Ht 5\' 7"  (1.702 m)   Wt 81.6 kg   SpO2 99%   BMI 28.19 kg/m  Physical Exam Vitals and nursing note reviewed. Exam conducted with a chaperone present ).  Constitutional:      General: She is not in acute distress.    Appearance: She is well-developed. She is not ill-appearing or diaphoretic.  HENT:     Head: Normocephalic and atraumatic.  Eyes:     Conjunctiva/sclera: Conjunctivae normal.  Cardiovascular:     Rate and Rhythm: Normal rate and regular rhythm.     Heart sounds: No murmur heard. Pulmonary:     Effort: Pulmonary effort is normal. No respiratory distress.     Breath sounds: Normal breath sounds. No stridor. No wheezing, rhonchi or rales.  Abdominal:     General: Abdomen is flat.  Palpations: Abdomen is soft.     Tenderness: There is abdominal tenderness in the right lower quadrant, suprapubic area and left lower quadrant. There is no guarding.  Genitourinary:    Exam position: Lithotomy position.     Vagina: Normal.     Cervix: No cervical bleeding.     Uterus: Not tender.      Adnexa:        Right: Tenderness present.        Left: Tenderness present.      Comments: No blood noticed near the cervix.  Small amount of white discharge.  There is bilateral adnexal tenderness with right worse than left. Musculoskeletal:        General: No swelling.     Cervical back: Neck supple.  Skin:    General: Skin is warm and dry.     Capillary Refill: Capillary refill takes less than 2 seconds.  Neurological:     General: No focal deficit  present.     Mental Status: She is alert and oriented to person, place, and time.  Psychiatric:        Mood and Affect: Mood normal.     ED Results / Procedures / Treatments   Labs (all labs ordered are listed, but only abnormal results are displayed) Labs Reviewed  CBC WITH DIFFERENTIAL/PLATELET - Abnormal; Notable for the following components:      Result Value   RBC 3.86 (*)    HCT 35.3 (*)    All other components within normal limits  COMPREHENSIVE METABOLIC PANEL - Abnormal; Notable for the following components:   Potassium 3.4 (*)    CO2 20 (*)    Glucose, Bld 115 (*)    All other components within normal limits  URINALYSIS, ROUTINE W REFLEX MICROSCOPIC - Abnormal; Notable for the following components:   Bacteria, UA FEW (*)    All other components within normal limits  LIPASE, BLOOD  I-STAT BETA HCG BLOOD, ED ([facility], [facility], [facility] ONLY)    EKG None  Radiology US Pelvis Complete  Result Date: 07/08/2022 CLINICAL DATA:  Left adnexal abnormality on CT. EXAM: TRANSABDOMINAL ULTRASOUND OF PELVIS DOPPLER ULTRASOUND OF OVARIES TECHNIQUE: Transabdominal ultrasound examination of the pelvis was performed including evaluation of the uterus, ovaries, adnexal regions, and pelvic cul-de-sac. Color and duplex Doppler ultrasound was utilized to evaluate blood flow to the ovaries. COMPARISON:  None Available. FINDINGS: Uterus Measurements: 8.03 cm x 3.02 cm x 4.09 cm = volume: 51.93 mL. No fibroids or other mass visualized. Endometrium Thickness: 5.95 mm.  No focal abnormality visualized. Right ovary Measurements: 2.80 cm x 1.93 cm x 2.33 cm = volume: 7.94 mL. Normal appearance/no adnexal mass. Left ovary Measurements: 3.92 cm x 2.49 cm x 2.73 cm = volume: 13.95 mL. The left ovary is seen within a 5.29 cm x 3.52 cm oval-shaped, septated anechoic structure. Pulsed Doppler evaluation demonstrates normal low-resistance arterial and venous waveforms in both ovaries. Other: There is a small amount of  pelvic free fluid. IMPRESSION: 1. Complex left adnexal cystic-appearing structure which contains the left ovary and corresponds to the area of abnormality seen on the prior abdomen pelvis CT. MRI correlation is recommended. 2. Normal bilateral ovarian flow. Electronically Signed   By: Aram Candela M.D.   On: 07/08/2022 01:46   [place] Art/Ven Flow Abd Pelv Doppler  Result Date: 07/08/2022 CLINICAL DATA:  Left adnexal abnormality on CT. EXAM: TRANSABDOMINAL ULTRASOUND OF PELVIS DOPPLER ULTRASOUND OF OVARIES TECHNIQUE: Transabdominal ultrasound examination of the pelvis was  performed including evaluation of the uterus, ovaries, adnexal regions, and pelvic cul-de-sac. Color and duplex Doppler ultrasound was utilized to evaluate blood flow to the ovaries. COMPARISON:  None Available. FINDINGS: Uterus Measurements: 8.03 cm x 3.02 cm x 4.09 cm = volume: 51.93 mL. No fibroids or other mass visualized. Endometrium Thickness: 5.95 mm.  No focal abnormality visualized. Right ovary Measurements: 2.80 cm x 1.93 cm x 2.33 cm = volume: 7.94 mL. Normal appearance/no adnexal mass. Left ovary Measurements: 3.92 cm x 2.49 cm x 2.73 cm = volume: 13.95 mL. The left ovary is seen within a 5.29 cm x 3.52 cm oval-shaped, septated anechoic structure. Pulsed Doppler evaluation demonstrates normal low-resistance arterial and venous waveforms in both ovaries. Other: There is a small amount of pelvic free fluid. IMPRESSION: 1. Complex left adnexal cystic-appearing structure which contains the left ovary and corresponds to the area of abnormality seen on the prior abdomen pelvis CT. MRI correlation is recommended. 2. Normal bilateral ovarian flow. Electronically Signed   By: Aram Candela M.D.   On: 07/08/2022 01:46   CT Abdomen Pelvis W Contrast  Result Date: 07/07/2022 CLINICAL DATA:  Abdominal pain since this morning. Denies nausea and vomiting. The pain location is in the lower middle of the abdomen EXAM: CT ABDOMEN AND PELVIS  WITH CONTRAST TECHNIQUE: Multidetector CT imaging of the abdomen and pelvis was performed using the standard protocol following bolus administration of intravenous contrast. RADIATION DOSE REDUCTION: This exam was performed according to the departmental dose-optimization program which includes automated exposure control, adjustment of the mA and/or kV according to patient size and/or use of iterative reconstruction technique. CONTRAST:  62mL OMNIPAQUE IOHEXOL 350 MG/ML SOLN COMPARISON:  Report from CT abdomen and pelvis 04/18/2013; no images available FINDINGS: Lower chest: No acute abnormality. Hepatobiliary: No focal liver abnormality is seen. No gallstones, gallbladder wall thickening, or biliary dilatation. Pancreas: Unremarkable. No pancreatic ductal dilatation or surrounding inflammatory changes. Spleen: Normal in size without focal abnormality. Adrenals/Urinary Tract: Adrenal glands are unremarkable. Kidneys are normal, without renal calculi, focal lesion, or hydronephrosis. Bladder is unremarkable. Stomach/Bowel: Stomach is within normal limits. Appendix appears normal. No evidence of bowel wall thickening, distention, or inflammatory changes. Vascular/Lymphatic: No significant vascular findings are present. No enlarged abdominal or pelvic lymph nodes. Reproductive: 4.9 cm left adnexal cyst with possible enhancing peripheral nodularity versus prominent vessels. Fluid-filled structure posterior to the uterus (circa series 3/image 72). Small volume intermediate density fluid in the cul-de-sac. Other: No free intraperitoneal air. Musculoskeletal: No acute or significant osseous findings. IMPRESSION: Intermediate density fluid in the cul-de-sac may represent blood products from a hemorrhagic cyst though is indeterminate. There is a low-density 4.8 cm cystic lesion in the left adnexa. Pelvic ultrasound to further evaluate both of these findings is recommended. Electronically Signed   By: Minerva Fester M.D.    On: 07/07/2022 22:35    Procedures Procedures    Medications Ordered in ED Medications  HYDROcodone-acetaminophen (NORCO/VICODIN) 5-325 MG per tablet 1 tablet (has no administration in time range)  iohexol (OMNIPAQUE) 350 MG/ML injection 75 mL (75 mLs Intravenous Contrast Given 07/07/22 2223)  HYDROcodone-acetaminophen (NORCO/VICODIN) 5-325 MG per tablet 1 tablet (1 tablet Oral Given 07/08/22 1141)    ED Course/ Medical Decision Making/ A&P Clinical Course as of 07/08/22 1850  Tue Jul 08, 2022  1320 CT Abdomen Pelvis W Contrast [DR]  1410 Reviewed ct and [place] reports and note ct advised [place] and [place] advised mri.  Messaged Dr. Deanne Coffer who is consulting with pelvic  us rad.  Discussed with Dr. Eppie GibsonStahl who advises most likely cyst- correlate clinically and may have op f/u with gyn for repeat us [DR]    Clinical Course User Index [DR] Margarita Grizzleay, Danielle, MD                           Medical Decision Making Amount and/or Complexity of Data Reviewed Labs: ordered.  Risk Prescription drug management.  This patient presents to the ED for concern of lower abdominal pain, this involves an extensive number of treatment options, and is a complaint that carries with it a high risk of complications and morbidity.  Differential diagnosis of her lower abdominal considerations include pelvic inflammatory disease, ectopic pregnancy, appendicitis, urinary calculi, primary dysmenorrhea, septic abortion, ruptured ovarian cyst or tumor, ovarian torsion, tubo-ovarian abscess, degeneration of fibroid, endometriosis, diverticulitis, cystitis.    Co morbidities that complicate the patient evaluation  PTSD, oppositional defiant disorder, depression, anxiety, arthritis, hypertension, history of substance abuse  My initial workup includes abdominal pain labs, CT abdomen pelvis  Additional history obtained from: Nursing notes from this visit.   I ordered, reviewed and interpreted labs which include: CMP, CBC,.  CT,  lipase, urinalysis, wet prep. slight hypokalemia 3.4.  All other labs normal   I ordered imaging studies including CT abdomen pelvis with contrast, ultrasound pelvis. I independently visualized and interpreted imaging which showed CT shows complex left adnexal cyst measuring 4.8 cm.  Ultrasound showed no evidence of torsion.  Redemonstration of left ovarian cyst I agree with the radiologist interpretation  Afebrile, hemodynamically stable.  26 year old female presenting to the ED for evaluation of sudden onset of lower abdominal pain which began yesterday morning.  Pain was worse at onset and has gotten slightly better since that time, however still rates it at a 5 out of 10.  Physical exam remarkable for lower abdominal tenderness on the right worse than left although the cyst is on the left adnexa.  Pelvic exam significant for bilateral adnexal tenderness with right worse than left, but is moderate.  Wet prep did show clue cells.  Patient will be treated for bacterial vaginosis.  Lab work-up relatively unremarkable.  Patient slightly hypokalemic and will be treated prior to discharge.  CT shows complex left adnexal cyst measuring 4.8 cm.  Ultrasound shows same.  No evidence of torsion.  Ultrasound did recommend MRI follow-up, however Dr. Rosalia Hammersay spoke with Dr. Deanne CofferHassell who spoke with Dr. Edmon CrapeStoll who believes it is most likely a cyst and she is stable to follow-up with gynecology for repeat ultrasound assuming that physical exam is unremarkable.  Patient does not have a primary gynecologic provider.  Will place ambulatory referral to the [facility].  We will send a short course of Norco for pain.  She was educated on the side effects of opioid pain medicine and encouraged not to drive.  Encourage patient to call to schedule appointment if she does not hear back in 72 hours.  Gave patient strict return precautions.  Stable at discharge.  At this time there does not appear to be any evidence of an acute  emergency medical condition and the patient appears stable for discharge with appropriate outpatient follow up. Diagnosis was discussed with patient who verbalizes understanding of care plan and is agreeable to discharge. I have discussed return precautions with patient who verbalizes understanding. Patient encouraged to follow-up with their PCP within 1 week. All questions answered.  Patient's case discussed with Dr. Lockie Molauratolo  who agrees with plan to discharge with follow-up.   Note: Portions of this report may have been transcribed using voice recognition software. Every effort was made to ensure accuracy; however, inadvertent computerized transcription errors may still be present.          Final Clinical Impression(s) / ED Diagnoses Final diagnoses:  None    Rx / DC Orders ED Discharge Orders     None         Michelle Piper, Cordelia Poche 07/08/22 2219    Virgina Norfolk, DO 07/08/22 2332

## 2022-07-08 NOTE — ED Notes (Signed)
Pt no longer wants vitals taken until they are brought back to a room

## 2022-07-08 NOTE — ED Notes (Signed)
Patient verbalizes understanding of discharge instructions. Opportunity for questioning and answers were provided. Armband removed by staff, pt discharged from ED. Ambulated out to lobby with spouse  

## 2022-07-08 NOTE — Discharge Instructions (Signed)
You have been seen today for your complaint of lower abdominal pain. Your lab work shows that you have bacterial vaginosis, was otherwise unremarkable. Your imaging shows that you have a complex ovarian cyst. Your discharge medications include Flagyl.  This is an antibiotic.  You should take it as prescribed.  You should take it for the entire duration of the prescription.  He should not drink alcohol while using this medication. Norco.  This is an opioid pain medication.  You should only take it as needed.  He should not drive or operate heavy machinery while taking this medication.  It may cause drowsiness.  It may also cause constipation.  You may use a laxative while taking this medication [facility]  They should call you within the next 48 hours.  If you do not hear back from them in the next 72 hours, you should give them a call. Please seek immediate medical care if you develop any of the following symptoms: You have abdominal or pelvic pain that is severe or gets worse. You cannot eat or drink without vomiting. You suddenly develop a fever or chills. Your menstrual period is much heavier than usual. At this time there does not appear to be the presence of an emergent medical condition, however there is always the potential for conditions to change. Please read and follow the below instructions.  Do not take your medicine if  develop an itchy rash, swelling in your mouth or lips, or difficulty breathing; call 911 and seek immediate emergency medical attention if this occurs.  You may review your lab tests and imaging results in their entirety on your MyChart account.  Please discuss all results of fully with your primary care provider and other specialist at your follow-up visit.  Note: Portions of this text may have been transcribed using voice recognition software. Every effort was made to ensure accuracy; however, inadvertent computerized transcription errors may  still be present.
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
## 2023-10-09 ENCOUNTER — Emergency Department (HOSPITAL_BASED_OUTPATIENT_CLINIC_OR_DEPARTMENT_OTHER)
Admission: EM | Admit: 2023-10-09 | Discharge: 2023-10-09 | Discharge status: Against Medical Advice | Payer: 59 | Attending: Emergency Medicine | Admitting: Emergency Medicine

## 2023-10-09 ENCOUNTER — Encounter (HOSPITAL_BASED_OUTPATIENT_CLINIC_OR_DEPARTMENT_OTHER): Payer: Self-pay | Admitting: Emergency Medicine

## 2023-10-09 ENCOUNTER — Other Ambulatory Visit: Payer: Self-pay

## 2023-10-09 DIAGNOSIS — R55 Syncope and collapse: Secondary | ICD-10-CM | POA: Diagnosis present

## 2023-10-09 DIAGNOSIS — Z5321 Procedure and treatment not carried out due to patient leaving prior to being seen by health care provider: Secondary | ICD-10-CM | POA: Insufficient documentation

## 2023-10-09 LAB — CBC
HCT: 38 % (ref 36.0–46.0)
Hemoglobin: 13.1 g/dL (ref 12.0–15.0)
MCH: 32 pg (ref 26.0–34.0)
MCHC: 34.5 g/dL (ref 30.0–36.0)
MCV: 92.7 fL (ref 80.0–100.0)
Platelets: 317 K/uL (ref 150–400)
RBC: 4.1 MIL/uL (ref 3.87–5.11)
RDW: 12.4 % (ref 11.5–15.5)
WBC: 7.9 K/uL (ref 4.0–10.5)
nRBC: 0 % (ref 0.0–0.2)

## 2023-10-09 LAB — BASIC METABOLIC PANEL WITH GFR
Anion gap: 8 (ref 5–15)
BUN: 13 mg/dL (ref 6–20)
CO2: 26 mmol/L (ref 22–32)
Calcium: 9.6 mg/dL (ref 8.9–10.3)
Chloride: 104 mmol/L (ref 98–111)
Creatinine, Ser: 0.9 mg/dL (ref 0.44–1.00)
GFR, Estimated: >60 mL/min
Glucose, Bld: 124 mg/dL — ABNORMAL HIGH (ref 70–99)
Potassium: 4.2 mmol/L (ref 3.5–5.1)
Sodium: 138 mmol/L (ref 135–145)

## 2023-10-09 NOTE — ED Triage Notes (Signed)
 Loc today after out patient skin tag/hemorrhoid removal Seen by EMS and reports low BP 77/40 and 82/38, cbg 113 and EKG NS Patient refused transport with  ems per patient due to cost  Had local [facility] for procedure
